# Patient Record
Sex: Female | Born: 1965 | Race: White | Hispanic: No | Smoking: Current every day smoker
Health system: Southern US, Community
[De-identification: ages and names within clinical notes are randomized; demographics above are authoritative.]

## PROBLEM LIST (undated history)

## (undated) DIAGNOSIS — G5600 Carpal tunnel syndrome, unspecified upper limb: Secondary | ICD-10-CM

## (undated) DIAGNOSIS — F909 Attention-deficit hyperactivity disorder, unspecified type: Secondary | ICD-10-CM

## (undated) DIAGNOSIS — F419 Anxiety disorder, unspecified: Secondary | ICD-10-CM

## (undated) DIAGNOSIS — G8929 Other chronic pain: Secondary | ICD-10-CM

## (undated) DIAGNOSIS — M1990 Unspecified osteoarthritis, unspecified site: Secondary | ICD-10-CM

---

## 1989-04-08 HISTORY — PX: LASER ABLATION OF THE CERVIX: SHX1949

## 2007-12-08 HISTORY — PX: ABDOMINAL HYSTERECTOMY: SHX81

## 2011-04-09 HISTORY — PX: BREAST EXCISIONAL BIOPSY: SUR124

## 2011-07-11 ENCOUNTER — Other Ambulatory Visit: Payer: Self-pay | Admitting: Family Medicine

## 2011-07-11 DIAGNOSIS — N63 Unspecified lump in unspecified breast: Secondary | ICD-10-CM

## 2011-07-18 ENCOUNTER — Ambulatory Visit
Admission: RE | Admit: 2011-07-18 | Discharge: 2011-07-18 | Disposition: A | Discharge status: Home / Self Care | Payer: BC Managed Care – PPO | Source: Ambulatory Visit | Attending: Family Medicine | Admitting: Family Medicine

## 2011-07-18 ENCOUNTER — Other Ambulatory Visit: Payer: Self-pay | Admitting: Family Medicine

## 2011-07-18 ENCOUNTER — Other Ambulatory Visit: Payer: Self-pay

## 2011-07-18 DIAGNOSIS — N63 Unspecified lump in unspecified breast: Secondary | ICD-10-CM

## 2011-08-12 ENCOUNTER — Ambulatory Visit (INDEPENDENT_AMBULATORY_CARE_PROVIDER_SITE_OTHER): Payer: BC Managed Care – PPO | Admitting: General Surgery

## 2011-08-30 ENCOUNTER — Ambulatory Visit (INDEPENDENT_AMBULATORY_CARE_PROVIDER_SITE_OTHER): Payer: BC Managed Care – PPO | Admitting: General Surgery

## 2011-09-13 ENCOUNTER — Ambulatory Visit (INDEPENDENT_AMBULATORY_CARE_PROVIDER_SITE_OTHER): Payer: BC Managed Care – PPO | Admitting: General Surgery

## 2011-09-13 ENCOUNTER — Encounter (INDEPENDENT_AMBULATORY_CARE_PROVIDER_SITE_OTHER): Payer: Self-pay | Admitting: General Surgery

## 2011-09-13 VITALS — BP 118/82 | HR 79 | Temp 97.0°F | Resp 14 | Ht 67.0 in | Wt 141.6 lb

## 2011-09-13 DIAGNOSIS — Z09 Encounter for follow-up examination after completed treatment for conditions other than malignant neoplasm: Secondary | ICD-10-CM

## 2011-09-13 NOTE — Progress Notes (Signed)
Patient ID: Laura Alexander, female   DOB: [birth year], 46 y.o.   MRN: [id number]  Chief Complaint  Patient presents with  . New Evaluation    New Pt. Fiberadenoma Right Br.    HPI Laura Alexander is a 46 y.o. female.  Referred by Dr. Laveda Abbe HPI This is a 3 yof who has had a right breast mass x 2 since 2009.  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  The upper inner quadrant mass has been increasingly painful and has enlarged over this time.  U/S now shows this to be 1.4x2.8x4.5 cm in size that is greater in size than in 2012.  There is an unchanged 0.7x1.1x1.2 cm mass 3 cm from the nipple.  History reviewed. No pertinent past medical history.  Past Surgical History  Procedure Date  . Abdominal hysterectomy 12/2007    Partial  . 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 No pertinent family history.  Social History History  Substance Use Topics  . Smoking status: Current Everyday Smoker -- 1.0 packs/day  . Smokeless tobacco: Never Used  . Alcohol Use: Yes     Social    Allergies  Allergen Reactions  . Eggs Or Egg-Derived Products Anaphylaxis    Tongue, throat, and ears itch  . Codeine Nausea And Vomiting    Current Outpatient Prescriptions  Medication Sig Dispense Refill  . amphetamine-dextroamphetamine (ADDERALL) 30 MG tablet Take 30 mg by mouth 2 (two) times daily.      . Chlorzoxazone (LORZONE) 750 MG TABS Take by mouth 1 day or 1 dose.      . Vitamin D, Ergocalciferol, (DRISDOL) 50000 UNITS CAPS Take 50,000 Units by mouth every 7 (seven) days.        Review of Systems Review of Systems  Constitutional: Negative for fever, chills and unexpected weight change.  HENT: Negative for hearing loss, congestion, sore throat, trouble swallowing and voice change.   Eyes: Negative for visual disturbance.  Respiratory: Negative for cough and wheezing.   Cardiovascular: Negative for chest pain,  palpitations and leg swelling.  Gastrointestinal: Negative for nausea, vomiting, abdominal pain, diarrhea, constipation, blood in stool, abdominal distention and anal bleeding.  Genitourinary: Negative for hematuria, vaginal bleeding and difficulty urinating.  Musculoskeletal: Negative for arthralgias.  Skin: Negative for rash and wound.  Neurological: Negative for seizures, syncope and headaches.  Hematological: Negative for adenopathy. Does not bruise/bleed easily.  Psychiatric/Behavioral: Negative for confusion.    Blood pressure 118/82, pulse 79, temperature 97 F (36.1 C), temperature source Temporal, resp. rate 14, height 5\' 7"  (1.702 m), weight 141 lb 9.6 oz (64.229 kg).  Physical Exam Physical Exam  Vitals reviewed. Constitutional: She appears well-developed and well-nourished.  Cardiovascular: Normal rate, regular rhythm and normal heart sounds.   Pulmonary/Chest: Effort normal. She has no wheezes. She has no rales. Right breast exhibits mass.    Lymphadenopathy:    She has no cervical adenopathy.    Data Reviewed *RADIOLOGY REPORT*  Clinical Data: 46 year old female with enlarging painful right  breast mass, which has been previously biopsied in 2009 with  reported result of fibroadenoma. The patient also complains of a  stable mass in her lower right breast, which has been unchanged in  her opinions for 15 years.  DIGITAL DIAGNOSTIC RIGHT MAMMOGRAM WITH CAD AND RIGHT BREAST  ULTRASOUND:  Comparison: Prior mammograms and ultrasound from [facility] in  [place] dated 07/27/2007 and  07/28/2007.  Prior mammogram from [facility] in [place] dated  01/11/2011.  Findings: Routine and spot compression views of the right breast  again demonstrate heterogeneously dense breast tissue.  A 4.5 x 5 cm circumscribed mass in the inner right breast contains  a biopsy clip and appears enlarged since prior studies.  A 1 x 1.4 cm circumscribed  oval mass in the lower right breast is  unchanged from 2009.  No other suspicious mass, distortion or worrisome calcifications  are noted within the right breast.  Mammographic images were processed with CAD.  On physical exam, a firm palpable mobile mass is identified at the  2 o'clock position 3 cm from the nipple.  A firm palpable mobile mass is identified at the 6:30 o'clock  position 3 cm from the nipple.  Ultrasound is performed, showing a 1.4 x 2.8 x 4.5 cm circumscribed  oval hypoechoic mass at the 2 o'clock position of the right breast  3 cm from the nipple, previously measuring 1 x 2.6 x 3.9 cm on  01/11/2011.  A 0.7 x 1.1 x 1.2 cm circumscribed oval hypoechoic mass at the 6:30  position of the right breast 3 cm from the nipple is also  identified.  No enlarged or abnormal appearing lymph nodes in the right axilla  are noted.  IMPRESSION:  Enlarging 1.4 x 2.8 x 4.5 cm mass in the upper inner right breast.  Although this may represent an enlarging fibroadenoma, a phylloides  tumor is not excluded. Given this fact and patient's  discomfort/pain, surgical consultation is recommended for possible  excision.   Assessment    Right breast mass x 2    Plan    I think both of these should be excised. The UIQ one is larger and may end up causing a cosmetic defect but would wait until she recovers to see if any further surgery will be needed.  I told her I think her breast will keep its shape but may just be smaller than the other side.  The risks include bleeding, infection, possibly further surgery.  She also has carpal tunnel issues that she is due to see hand surgery for. I gave her the name of the hand surgeons in [place] and would be happy to combine this with them.  I will wait to hear back from her either way.        Laura Alexander 09/13/2011, 9:50 AM

## 2011-11-29 ENCOUNTER — Other Ambulatory Visit (INDEPENDENT_AMBULATORY_CARE_PROVIDER_SITE_OTHER): Payer: Self-pay | Admitting: General Surgery

## 2011-11-29 ENCOUNTER — Telehealth (INDEPENDENT_AMBULATORY_CARE_PROVIDER_SITE_OTHER): Payer: Self-pay

## 2011-11-29 NOTE — Telephone Encounter (Signed)
LMOM at work and cell for pt to call me back. I spoke to Dr Dwain Sarna about moving pt's appt up earlier than the appt for 8/30 but we don't have anything earlier now. I did get Dr Dwain Sarna to complete surgical orders so I will take the orders to scheduling once I have spoken to the pt about Dr Doreen Salvage plan.

## 2011-12-02 NOTE — Telephone Encounter (Signed)
LMOM another message for the pt to call me back so I can explain about getting her scheduled for surgery.

## 2011-12-02 NOTE — Telephone Encounter (Signed)
Pt returned my call. I explained to her Dr Doreen Salvage plan about going a head and scheduling a date for sx so when she comes to her appt that date will already be set up and there want be any delay. The pt understands and is ok with scheduling. I took her surgical orders to scheduling.

## 2011-12-06 ENCOUNTER — Ambulatory Visit (INDEPENDENT_AMBULATORY_CARE_PROVIDER_SITE_OTHER): Payer: BC Managed Care – PPO | Admitting: General Surgery

## 2011-12-06 ENCOUNTER — Other Ambulatory Visit (INDEPENDENT_AMBULATORY_CARE_PROVIDER_SITE_OTHER): Payer: Self-pay | Admitting: General Surgery

## 2011-12-06 ENCOUNTER — Encounter (INDEPENDENT_AMBULATORY_CARE_PROVIDER_SITE_OTHER): Payer: Self-pay | Admitting: General Surgery

## 2011-12-06 VITALS — BP 132/78 | HR 94 | Ht 67.0 in | Wt 143.0 lb

## 2011-12-06 DIAGNOSIS — N63 Unspecified lump in unspecified breast: Secondary | ICD-10-CM

## 2011-12-06 NOTE — Progress Notes (Signed)
Patient ID: Laura Alexander, female   DOB: 01-20-1966, 46 y.o.   MRN: [id number]  Chief Complaint  Patient presents with  . Follow-up    reck Rt br mass    HPI Laura Alexander is a 46 y.o. female.   HPI This is a 89 yof who has had a right breast mass x 2 since 2009. 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 The upper inner quadrant mass has been increasingly painful and has enlarged over this time. U/S now shows this to be 1.4x2.8x4.5 cm in size that is greater in size than in 2012. There is an unchanged 0.7x1.1x1.2 cm mass 3 cm from the nipple. I recommended a biopsy previously and she returns today to discuss.  She reports no changes since last visit.  History reviewed. No pertinent past medical history.  Past Surgical History  Procedure Date  . Abdominal hysterectomy 12/2007    Partial  . 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 No pertinent family history.  Social History History  Substance Use Topics  . Smoking status: Current Everyday Smoker -- 1.0 packs/day  . Smokeless tobacco: Never Used  . Alcohol Use: Yes     Social    Allergies  Allergen Reactions  . Eggs Or Egg-Derived Products Anaphylaxis    Tongue, throat, and ears itch  . Codeine Nausea And Vomiting    Current Outpatient Prescriptions  Medication Sig Dispense Refill  . amphetamine-dextroamphetamine (ADDERALL) 30 MG tablet Take 30 mg by mouth 2 (two) times daily.      . Chlorzoxazone (LORZONE) 750 MG TABS Take by mouth 1 day or 1 dose.      . Vitamin D, Ergocalciferol, (DRISDOL) 50000 UNITS CAPS Take 50,000 Units by mouth every 7 (seven) days.        Review of Systems Review of Systems  Blood pressure 132/78, pulse 94, height 5\' 7"  (1.702 m), weight 143 lb (64.864 kg), SpO2 96.00%.  Physical Exam Physical Exam  Vitals reviewed. Constitutional: She appears well-developed and well-nourished.  Neck: Neck supple.    Cardiovascular: Normal rate, regular rhythm and normal heart sounds.   Pulmonary/Chest: Effort normal and breath sounds normal. She has no wheezes. She has no rales. Right breast exhibits mass. Right breast exhibits no inverted nipple, no nipple discharge, no skin change and no tenderness. Left breast exhibits no inverted nipple, no mass, no nipple discharge, no skin change and no tenderness. Breasts are symmetrical.    Lymphadenopathy:    She has no cervical adenopathy.    She has no axillary adenopathy.       Right: No supraclavicular adenopathy present.       Left: No supraclavicular adenopathy present.    Data Reviewed DIGITAL DIAGNOSTIC RIGHT MAMMOGRAM WITH CAD AND RIGHT BREAST  ULTRASOUND:  Comparison: Prior mammograms and ultrasound from [facility] in [facility] dated 07/27/2007 and  07/28/2007.  Prior mammogram from [facility] in [place] dated  01/11/2011.  Findings: Routine and spot compression views of the right breast  again demonstrate heterogeneously dense breast tissue.  A 4.5 x 5 cm circumscribed mass in the inner right breast contains  a biopsy clip and appears enlarged since prior studies.  A 1 x 1.4 cm circumscribed oval mass in the lower right breast is  unchanged from 2009.  No other suspicious mass, distortion or worrisome calcifications  are noted within the  right breast.  Mammographic images were processed with CAD.  On physical exam, a firm palpable mobile mass is identified at the  2 o'clock position 3 cm from the nipple.  A firm palpable mobile mass is identified at the 6:30 o'clock  position 3 cm from the nipple.  Ultrasound is performed, showing a 1.4 x 2.8 x 4.5 cm circumscribed  oval hypoechoic mass at the 2 o'clock position of the right breast  3 cm from the nipple, previously measuring 1 x 2.6 x 3.9 cm on  01/11/2011.  A 0.7 x 1.1 x 1.2 cm circumscribed oval hypoechoic mass at the 6:30  position of the right  breast 3 cm from the nipple is also  identified.  No enlarged or abnormal appearing lymph nodes in the right axilla  are noted.  IMPRESSION:  Enlarging 1.4 x 2.8 x 4.5 cm mass in the upper inner right breast.  Although this may represent an enlarging fibroadenoma, a phylloides  tumor is not excluded. Given this fact and patient's  discomfort/pain, surgical consultation is recommended for possible  excision.  Stable 0.7 x 1.1 x 1.2 cm benign mass in the lower right breast -  likely a fibroadenoma and unchanged over a 4-year time period.    Assessment    Right breast mass x2    Plan    Right breast mass excision and wire localization of smaller mass I think both of these should be excised. The UIQ one is larger and may end up causing a cosmetic defect but would wait until she recovers to see if any further surgery will be needed. I told her I think her breast will keep its shape but may just be smaller than the other side. The risks include bleeding, infection, possibly further surgery.         Laura Alexander 12/06/2011, 9:40 AM

## 2011-12-11 ENCOUNTER — Encounter (HOSPITAL_BASED_OUTPATIENT_CLINIC_OR_DEPARTMENT_OTHER)
Admission: RE | Admit: 2011-12-11 | Discharge: 2011-12-11 | Disposition: A | Discharge status: Home / Self Care | Payer: BC Managed Care – PPO | Source: Ambulatory Visit | Attending: General Surgery | Admitting: General Surgery

## 2011-12-11 ENCOUNTER — Encounter (HOSPITAL_BASED_OUTPATIENT_CLINIC_OR_DEPARTMENT_OTHER): Payer: Self-pay | Admitting: *Deleted

## 2011-12-11 LAB — CBC
HCT: 37.4 % (ref 36.0–46.0)
MCH: 31.7 pg (ref 26.0–34.0)
MCV: 91.2 fL (ref 78.0–100.0)
RDW: 12.6 % (ref 11.5–15.5)
WBC: 9.9 10*3/uL (ref 4.0–10.5)

## 2011-12-11 LAB — BASIC METABOLIC PANEL
CO2: 22 mEq/L (ref 19–32)
Calcium: 9.3 mg/dL (ref 8.4–10.5)
Chloride: 101 mEq/L (ref 96–112)
Creatinine, Ser: 0.55 mg/dL (ref 0.50–1.10)
Glucose, Bld: 110 mg/dL — ABNORMAL HIGH (ref 70–99)

## 2011-12-11 NOTE — Progress Notes (Signed)
To come in for labs Pt also sch for ctr here 9/13

## 2011-12-12 ENCOUNTER — Encounter (HOSPITAL_BASED_OUTPATIENT_CLINIC_OR_DEPARTMENT_OTHER): Payer: Self-pay | Admitting: Certified Registered"

## 2011-12-12 ENCOUNTER — Encounter (HOSPITAL_BASED_OUTPATIENT_CLINIC_OR_DEPARTMENT_OTHER): Payer: Self-pay | Admitting: *Deleted

## 2011-12-12 ENCOUNTER — Encounter (HOSPITAL_BASED_OUTPATIENT_CLINIC_OR_DEPARTMENT_OTHER)
Admission: RE | Disposition: A | Discharge status: Home / Self Care | Payer: Self-pay | Source: Ambulatory Visit | Attending: General Surgery

## 2011-12-12 ENCOUNTER — Ambulatory Visit
Admission: RE | Admit: 2011-12-12 | Discharge: 2011-12-12 | Disposition: A | Discharge status: Home / Self Care | Payer: BC Managed Care – PPO | Source: Ambulatory Visit | Attending: General Surgery | Admitting: General Surgery

## 2011-12-12 ENCOUNTER — Other Ambulatory Visit (INDEPENDENT_AMBULATORY_CARE_PROVIDER_SITE_OTHER): Payer: Self-pay | Admitting: General Surgery

## 2011-12-12 ENCOUNTER — Ambulatory Visit (HOSPITAL_BASED_OUTPATIENT_CLINIC_OR_DEPARTMENT_OTHER)
Admission: RE | Admit: 2011-12-12 | Discharge: 2011-12-12 | Disposition: A | Discharge status: Home / Self Care | Payer: BC Managed Care – PPO | Source: Ambulatory Visit | Attending: General Surgery | Admitting: General Surgery

## 2011-12-12 ENCOUNTER — Ambulatory Visit (HOSPITAL_BASED_OUTPATIENT_CLINIC_OR_DEPARTMENT_OTHER): Payer: BC Managed Care – PPO | Admitting: Certified Registered"

## 2011-12-12 DIAGNOSIS — Z01812 Encounter for preprocedural laboratory examination: Secondary | ICD-10-CM | POA: Insufficient documentation

## 2011-12-12 DIAGNOSIS — F17200 Nicotine dependence, unspecified, uncomplicated: Secondary | ICD-10-CM | POA: Insufficient documentation

## 2011-12-12 DIAGNOSIS — N63 Unspecified lump in unspecified breast: Secondary | ICD-10-CM

## 2011-12-12 DIAGNOSIS — N6019 Diffuse cystic mastopathy of unspecified breast: Secondary | ICD-10-CM

## 2011-12-12 DIAGNOSIS — D249 Benign neoplasm of unspecified breast: Secondary | ICD-10-CM | POA: Insufficient documentation

## 2011-12-12 HISTORY — DX: Carpal tunnel syndrome, unspecified upper limb: G56.00

## 2011-12-12 HISTORY — PX: BREAST BIOPSY: SHX20

## 2011-12-12 HISTORY — PX: MASS EXCISION: SHX2000

## 2011-12-12 SURGERY — EXCISION MASS
Anesthesia: General | Site: Breast | Laterality: Right | Wound class: Clean

## 2011-12-12 MED ORDER — HYDROCODONE-ACETAMINOPHEN 5-325 MG PO TABS: 1.0000 | ORAL_TABLET | Freq: Four times a day (QID) | ORAL | Status: DC | PRN

## 2011-12-12 MED ORDER — BUPIVACAINE HCL (PF) 0.25 % IJ SOLN
INTRAMUSCULAR | Status: DC | PRN
  Administered 2011-12-12: 20 mL

## 2011-12-12 MED ORDER — PROMETHAZINE HCL 25 MG/ML IJ SOLN: 6.2500 mg | INTRAMUSCULAR | Status: DC | PRN

## 2011-12-12 MED ORDER — CEFAZOLIN SODIUM 1-5 GM-% IV SOLN: 1.0000 g | INTRAVENOUS | Status: DC

## 2011-12-12 MED ORDER — HYDROMORPHONE HCL PF 1 MG/ML IJ SOLN
0.2500 mg | INTRAMUSCULAR | Status: DC | PRN
  Administered 2011-12-12 (×2): 0.5 mg via INTRAVENOUS

## 2011-12-12 MED ORDER — CEFAZOLIN SODIUM-DEXTROSE 2-3 GM-% IV SOLR
2.0000 g | INTRAVENOUS | Status: AC
  Administered 2011-12-12: 2 g via INTRAVENOUS

## 2011-12-12 MED ORDER — LIDOCAINE HCL (CARDIAC) 20 MG/ML IV SOLN
INTRAVENOUS | Status: DC | PRN
  Administered 2011-12-12: 20 mg via INTRAVENOUS

## 2011-12-12 MED ORDER — LACTATED RINGERS IV SOLN
INTRAVENOUS | Status: DC
  Administered 2011-12-12 (×2): via INTRAVENOUS

## 2011-12-12 MED ORDER — HYDROCODONE-ACETAMINOPHEN 5-325 MG PO TABS
1.0000 | ORAL_TABLET | Freq: Once | ORAL | Status: AC
  Administered 2011-12-12: 1 via ORAL

## 2011-12-12 MED ORDER — MEPERIDINE HCL 25 MG/ML IJ SOLN: 6.2500 mg | INTRAMUSCULAR | Status: DC | PRN

## 2011-12-12 MED ORDER — FENTANYL CITRATE 0.05 MG/ML IJ SOLN
INTRAMUSCULAR | Status: DC | PRN
  Administered 2011-12-12: 100 ug via INTRAVENOUS

## 2011-12-12 MED ORDER — PROPOFOL 10 MG/ML IV BOLUS
INTRAVENOUS | Status: DC | PRN
  Administered 2011-12-12: 200 mg via INTRAVENOUS

## 2011-12-12 MED ORDER — CEFAZOLIN SODIUM-DEXTROSE 2-3 GM-% IV SOLR: 2.0000 g | INTRAVENOUS | Status: DC

## 2011-12-12 MED ORDER — DEXAMETHASONE SODIUM PHOSPHATE 4 MG/ML IJ SOLN
INTRAMUSCULAR | Status: DC | PRN
  Administered 2011-12-12: 8 mg via INTRAVENOUS

## 2011-12-12 MED ORDER — MIDAZOLAM HCL 5 MG/5ML IJ SOLN
INTRAMUSCULAR | Status: DC | PRN
  Administered 2011-12-12: 2 mg via INTRAVENOUS

## 2011-12-12 MED ORDER — MIDAZOLAM HCL 2 MG/2ML IJ SOLN: 0.5000 mg | Freq: Once | INTRAMUSCULAR | Status: DC | PRN

## 2011-12-12 SURGICAL SUPPLY — 62 items
APPLIER CLIP 9.375 MED OPEN (MISCELLANEOUS)
BENZOIN TINCTURE PRP APPL 2/3 (GAUZE/BANDAGES/DRESSINGS) ×2 IMPLANT
BINDER BREAST LRG (GAUZE/BANDAGES/DRESSINGS) ×2 IMPLANT
BINDER BREAST MEDIUM (GAUZE/BANDAGES/DRESSINGS) IMPLANT
BINDER BREAST XLRG (GAUZE/BANDAGES/DRESSINGS) IMPLANT
BINDER BREAST XXLRG (GAUZE/BANDAGES/DRESSINGS) IMPLANT
BLADE SURG 15 STRL LF DISP TIS (BLADE) ×1 IMPLANT
BLADE SURG 15 STRL SS (BLADE) ×1
BLADE SURG ROTATE 9660 (MISCELLANEOUS) IMPLANT
CANISTER SUCTION 1200CC (MISCELLANEOUS) IMPLANT
CHLORAPREP W/TINT 26ML (MISCELLANEOUS) ×2 IMPLANT
CLIP APPLIE 9.375 MED OPEN (MISCELLANEOUS) IMPLANT
CLOTH BEACON ORANGE TIMEOUT ST (SAFETY) ×2 IMPLANT
COVER MAYO STAND STRL (DRAPES) ×2 IMPLANT
COVER TABLE BACK 60X90 (DRAPES) ×2 IMPLANT
DECANTER SPIKE VIAL GLASS SM (MISCELLANEOUS) IMPLANT
DERMABOND ADVANCED (GAUZE/BANDAGES/DRESSINGS)
DERMABOND ADVANCED .7 DNX12 (GAUZE/BANDAGES/DRESSINGS) IMPLANT
DEVICE DUBIN W/COMP PLATE 8390 (MISCELLANEOUS) ×4 IMPLANT
DRAPE PED LAPAROTOMY (DRAPES) ×2 IMPLANT
DRSG TEGADERM 4X4.75 (GAUZE/BANDAGES/DRESSINGS) ×2 IMPLANT
ELECT COATED BLADE 2.86 ST (ELECTRODE) ×2 IMPLANT
ELECT REM PT RETURN 9FT ADLT (ELECTROSURGICAL) ×2
ELECTRODE REM PT RTRN 9FT ADLT (ELECTROSURGICAL) ×1 IMPLANT
GAUZE PACKING IODOFORM 1/4X5 (PACKING) IMPLANT
GAUZE SPONGE 4X4 12PLY STRL LF (GAUZE/BANDAGES/DRESSINGS) ×2 IMPLANT
GLOVE BIO SURGEON STRL SZ7 (GLOVE) ×4 IMPLANT
GLOVE BIO SURGEON STRL SZ7.5 (GLOVE) ×2 IMPLANT
GLOVE BIOGEL PI IND STRL 6.5 (GLOVE) ×1 IMPLANT
GLOVE BIOGEL PI IND STRL 7.5 (GLOVE) ×1 IMPLANT
GLOVE BIOGEL PI INDICATOR 6.5 (GLOVE) ×1
GLOVE BIOGEL PI INDICATOR 7.5 (GLOVE) ×1
GOWN PREVENTION PLUS XLARGE (GOWN DISPOSABLE) ×4 IMPLANT
KIT PROBE COVER (MISCELLANEOUS) ×2 IMPLANT
NEEDLE HYPO 25X1 1.5 SAFETY (NEEDLE) ×2 IMPLANT
NS IRRIG 1000ML POUR BTL (IV SOLUTION) ×2 IMPLANT
PACK BASIN DAY SURGERY FS (CUSTOM PROCEDURE TRAY) ×2 IMPLANT
PENCIL BUTTON HOLSTER BLD 10FT (ELECTRODE) ×2 IMPLANT
SLEEVE SCD COMPRESS KNEE MED (MISCELLANEOUS) ×2 IMPLANT
SPONGE LAP 4X18 X RAY DECT (DISPOSABLE) ×2 IMPLANT
STRIP CLOSURE SKIN 1/2X4 (GAUZE/BANDAGES/DRESSINGS) ×2 IMPLANT
SUT ETHILON 2 0 FS 18 (SUTURE) IMPLANT
SUT MNCRL AB 4-0 PS2 18 (SUTURE) ×2 IMPLANT
SUT MON AB 5-0 PS2 18 (SUTURE) ×2 IMPLANT
SUT SILK 2 0 SH (SUTURE) ×2 IMPLANT
SUT VIC AB 2-0 SH 27 (SUTURE) ×1
SUT VIC AB 2-0 SH 27XBRD (SUTURE) ×1 IMPLANT
SUT VIC AB 3-0 SH 27 (SUTURE) ×1
SUT VIC AB 3-0 SH 27X BRD (SUTURE) ×1 IMPLANT
SUT VIC AB 5-0 PS2 18 (SUTURE) IMPLANT
SUT VICRYL 3-0 CR8 SH (SUTURE) IMPLANT
SUT VICRYL 4-0 PS2 18IN ABS (SUTURE) IMPLANT
SUT VICRYL AB 3 0 TIES (SUTURE) IMPLANT
SWAB CULTURE LIQ STUART DBL (MISCELLANEOUS) IMPLANT
SYR CONTROL 10ML LL (SYRINGE) ×2 IMPLANT
TOWEL OR 17X24 6PK STRL BLUE (TOWEL DISPOSABLE) ×2 IMPLANT
TOWEL OR NON WOVEN STRL DISP B (DISPOSABLE) ×2 IMPLANT
TUBE ANAEROBIC SPECIMEN COL (MISCELLANEOUS) IMPLANT
TUBE CONNECTING 20X1/4 (TUBING) IMPLANT
UNDERPAD 30X30 INCONTINENT (UNDERPADS AND DIAPERS) IMPLANT
WATER STERILE IRR 1000ML POUR (IV SOLUTION) IMPLANT
YANKAUER SUCT BULB TIP NO VENT (SUCTIONS) IMPLANT

## 2011-12-12 NOTE — Interval H&P Note (Signed)
History and Physical Interval Note:  12/12/2011 12:33 PM  Laura Alexander  has presented today for surgery, with the diagnosis of right breast masses X2  The various methods of treatment have been discussed with the patient and family. After consideration of risks, benefits and other options for treatment, the patient has consented to  Procedure(s) (LRB) with comments: EXCISION MASS (Right) - Right breast masses excisions times two, right breast wire loc biopsy BREAST BIOPSY WITH NEEDLE LOCALIZATION (Right) as a surgical intervention .  The patient's history has been reviewed, patient examined, no change in status, stable for surgery.  I have reviewed the patient's chart and labs.  Questions were answered to the patient's satisfaction.     [name]

## 2011-12-12 NOTE — Anesthesia Postprocedure Evaluation (Signed)
  Anesthesia Post-op Note  Patient: Laura Alexander  Procedure(s) Performed: Procedure(s) (LRB) with comments: EXCISION MASS (Right) - Right breast masses excisions times two, right breast wire localization biopsy BREAST BIOPSY WITH NEEDLE LOCALIZATION (Right)  Patient Location: PACU  Anesthesia Type: General  Level of Consciousness: awake, alert  and oriented  Airway and Oxygen Therapy: Patient Spontanous Breathing  Post-op Pain: none  Post-op Assessment: Post-op Vital signs reviewed, Patient's Cardiovascular Status Stable, Respiratory Function Stable, Patent Airway, No signs of Nausea or vomiting and Pain level controlled  Post-op Vital Signs: Reviewed and stable  Complications: No apparent anesthesia complications

## 2011-12-12 NOTE — Discharge Instructions (Signed)
[facility] °Office Phone Number [phone] ° °BREAST BIOPSY/ PARTIAL MASTECTOMY: POST OP INSTRUCTIONS ° °Always review your discharge instruction sheet given to you by the facility where your surgery was performed. ° °IF YOU HAVE DISABILITY OR FAMILY LEAVE FORMS, YOU MUST BRING THEM TO THE OFFICE FOR PROCESSING.  DO NOT GIVE THEM TO YOUR DOCTOR. ° °1. A prescription for pain medication may be given to you upon discharge.  Take your pain medication as prescribed, if needed.  If narcotic pain medicine is not needed, then you may take acetaminophen (Tylenol), naprosyn (Alleve) or ibuprofen (Advil) as needed. °2. Take your usually prescribed medications unless otherwise directed °3. If you need a refill on your pain medication, please contact your pharmacy.  They will contact our office to request authorization.  Prescriptions will not be filled after 5pm or on week-ends. °4. You should eat very light the first 24 hours after surgery, such as soup, crackers, pudding, etc.  Resume your normal diet the day after surgery. °5. Most patients will experience some swelling and bruising in the breast.  Ice packs and a good support bra will help.  Wear the breast binder provided or a sports bra for 72 hours day and night.  After that wear a sports bra during the day until you return to the office. Swelling and bruising can take several days to resolve.  °6. It is common to experience some constipation if taking pain medication after surgery.  Increasing fluid intake and taking a stool softener will usually help or prevent this problem from occurring.  A mild laxative (Milk of Magnesia or Miralax) should be taken according to package directions if there are no bowel movements after 48 hours. °7. Unless discharge instructions indicate otherwise, you may remove your bandages 48 hours after surgery and you may shower at that time.  You may have steri-strips (small skin tapes) in place directly over the incision.   These strips should be left on the skin for 7-10 days and will come off on their own.  If your surgeon used skin glue on the incision, you may shower in 24 hours.  The glue will flake off over the next 2-3 weeks.  Any sutures or staples will be removed at the office during your follow-up visit. °8. ACTIVITIES:  You may resume regular daily activities (gradually increasing) beginning the next day.  Wearing a good support bra or sports bra minimizes pain and swelling.  You may have sexual intercourse when it is comfortable. °a. You may drive when you no longer are taking prescription pain medication, you can comfortably wear a seatbelt, and you can safely maneuver your car and apply brakes. °b. RETURN TO WORK:  ______________________________________________________________________________________ °9. You should see your doctor in the office for a follow-up appointment approximately two weeks after your surgery.  Your doctor’s nurse will typically make your follow-up appointment when she calls you with your pathology report.  Expect your pathology report 3-4 business days after your surgery.  You may call to check if you do not hear from us after three days. °10. OTHER INSTRUCTIONS: _______________________________________________________________________________________________ _____________________________________________________________________________________________________________________________________ °_____________________________________________________________________________________________________________________________________ °_____________________________________________________________________________________________________________________________________ ° °WHEN TO CALL DR WAKEFIELD: °1. Fever over 101.0 °2. Nausea and/or vomiting. °3. Extreme swelling or bruising. °4. Continued bleeding from incision. °5. Increased pain, redness, or drainage from the incision. ° °The clinic staff is available to  answer your questions during regular business hours.  Please don’t hesitate to call and ask to speak to one of the nurses for   clinical concerns.  If you have a medical emergency, go to the nearest emergency room or call 911.  A surgeon from [facility] is always on call at the hospital. ° °For further questions, please visit [email or web address] ° °Post Anesthesia Home Care Instructions ° °Activity: °Get plenty of rest for the remainder of the day. A responsible adult should stay with you for 24 hours following the procedure.  °For the next 24 hours, DO NOT: °-Drive a car °-Operate machinery °-Drink alcoholic beverages °-Take any medication unless instructed by your physician °-Make any legal decisions or sign important papers. ° °Meals: °Start with liquid foods such as gelatin or soup. Progress to regular foods as tolerated. Avoid greasy, spicy, heavy foods. If nausea and/or vomiting occur, drink only clear liquids until the nausea and/or vomiting subsides. Call your physician if vomiting continues. ° °Special Instructions/Symptoms: °Your throat may feel dry or sore from the anesthesia or the breathing tube placed in your throat during surgery. If this causes discomfort, gargle with warm salt water. The discomfort should disappear within 24 hours. ° °

## 2011-12-12 NOTE — Transfer of Care (Signed)
Immediate Anesthesia Transfer of Care Note  Patient: Laura Alexander  Procedure(s) Performed: Procedure(s) (LRB) with comments: EXCISION MASS (Right) - Right breast masses excisions times two, right breast wire localization biopsy BREAST BIOPSY WITH NEEDLE LOCALIZATION (Right)  Patient Location: PACU  Anesthesia Type: General  Level of Consciousness: awake, alert , oriented and patient cooperative  Airway & Oxygen Therapy: Patient Spontanous Breathing and Patient connected to face mask oxygen  Post-op Assessment: Report given to PACU RN and Post -op Vital signs reviewed and stable  Post vital signs: Reviewed and stable  Complications: No apparent anesthesia complications

## 2011-12-12 NOTE — Anesthesia Procedure Notes (Signed)
Procedure Name: LMA Insertion Date/Time: 12/12/2011 12:40 PM Performed by: Verlan Friends Pre-anesthesia Checklist: Patient identified, Emergency Drugs available, Suction available, Patient being monitored and Timeout performed Patient Re-evaluated:Patient Re-evaluated prior to inductionOxygen Delivery Method: Circle System Utilized Preoxygenation: Pre-oxygenation with 100% oxygen Intubation Type: IV induction Ventilation: Mask ventilation without difficulty LMA: LMA inserted LMA Size: 4.0 Number of attempts: 1 Airway Equipment and Method: bite block Placement Confirmation: positive ETCO2 Tube secured with: Tape Dental Injury: Teeth and Oropharynx as per pre-operative assessment

## 2011-12-12 NOTE — Op Note (Signed)
Preoperative diagnosis: RIght breast mass x2 Postoperative diagnosis: Same as above Procedure: #1 right breast mass excision #2 right breast wire-guided excisional biopsy Surgeon: Dr. Harden Mo Anesthesia: Gen. 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 These have been increasing in size and we discussed excision. The risks and benefits were discussed prior to beginning.  Procedure: After informed consent was obtained I had the patient first with the breast around the wire placed in the inferior mass. This little but more difficult to feel. I had these mammograms available from our view in the operating room. She was administered 2 g of intravenous cefazolin. Sequential compression devices were placed on her legs. She was placed under  general anesthesia without consultation. Her right breast was prepped and draped in the standard sterile surgical fashion. A surgical timeout was performed.  I infiltrated Marcaine throughout the areas of both masses. I used the ultrasound to identify the superior mass. I made a periareolar incision and then used cautery to excise what appeared to be a fibroadenoma. The clip from her prior biopsy was evident as well. I did take a mammogram to confirm that the clip had been removed and this is the case. I then obtained hemostasis. I closed this with 2-0 Vicryl, 3-0 Vicryl, and 5-0 Monocryl. I then made the incision over the lower mass. I removed the wire as well as the palpable mass which also appeared to be a fibroadenoma. Mammogram was taken confirming  removal of the wire. I then closed this in a similar fashion. I infiltrated more Marcaine in both of these areas. I then placed Steri-Strips and sterile dressings over both of these. A breast binder was placed. She tolerated this well was extubated and transferred to recovery stable.

## 2011-12-12 NOTE — Anesthesia Preprocedure Evaluation (Signed)
Anesthesia Evaluation  Patient identified by MRN, date of birth, ID band Patient awake    Reviewed: Allergy & Precautions, H&P , NPO status   History of Anesthesia Complications Negative for: history of anesthetic complications  Airway Mallampati: I TM Distance: >3 FB Neck ROM: Full    Dental  (+) Caps and Dental Advisory Given   Pulmonary Current Smoker,  breath sounds clear to auscultation  Pulmonary exam normal       Cardiovascular negative cardio ROS  Rhythm:Regular Rate:Normal     Neuro/Psych    GI/Hepatic negative GI ROS, Neg liver ROS,   Endo/Other  negative endocrine ROS  Renal/GU negative Renal ROS     Musculoskeletal   Abdominal   Peds  Hematology negative hematology ROS (+)   Anesthesia Other Findings   Reproductive/Obstetrics                           Anesthesia Physical Anesthesia Plan  ASA: II  Anesthesia Plan: General   Post-op Pain Management:    Induction: Intravenous  Airway Management Planned: LMA  Additional Equipment:   Intra-op Plan:   Post-operative Plan:   Informed Consent: I have reviewed the patients History and Physical, chart, labs and discussed the procedure including the risks, benefits and alternatives for the proposed anesthesia with the patient or authorized representative who has indicated his/her understanding and acceptance.   Dental advisory given  Plan Discussed with: CRNA and Surgeon  Anesthesia Plan Comments: (Plan routine monitors, GA- LMA)        Anesthesia Quick Evaluation

## 2011-12-12 NOTE — H&P (View-Only) (Signed)
Patient ID: Laura Alexander, female   DOB: 08/28/1965, 46 y.o.   MRN: [id number]  Chief Complaint  Patient presents with  . Follow-up    reck Rt br mass    HPI Laura Alexander is a 46 y.o. female.   HPI This is a 46 yof who has had a right breast mass x 2 since 2009. 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 The upper inner quadrant mass has been increasingly painful and has enlarged over this time. U/S now shows this to be 1.4x2.8x4.5 cm in size that is greater in size than in 2012. There is an unchanged 0.7x1.1x1.2 cm mass 3 cm from the nipple. I recommended a biopsy previously and she returns today to discuss.  She reports no changes since last visit.  History reviewed. No pertinent past medical history.  Past Surgical History  Procedure Date  . Abdominal hysterectomy 12/2007    Partial  . 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 No pertinent family history.  Social History History  Substance Use Topics  . Smoking status: Current Everyday Smoker -- 1.0 packs/day  . Smokeless tobacco: Never Used  . Alcohol Use: Yes     Social    Allergies  Allergen Reactions  . Eggs Or Egg-Derived Products Anaphylaxis    Tongue, throat, and ears itch  . Codeine Nausea And Vomiting    Current Outpatient Prescriptions  Medication Sig Dispense Refill  . amphetamine-dextroamphetamine (ADDERALL) 30 MG tablet Take 30 mg by mouth 2 (two) times daily.      . Chlorzoxazone (LORZONE) 750 MG TABS Take by mouth 1 day or 1 dose.      . Vitamin D, Ergocalciferol, (DRISDOL) 50000 UNITS CAPS Take 50,000 Units by mouth every 7 (seven) days.        Review of Systems Review of Systems  Blood pressure 132/78, pulse 94, height 5' 7" (1.702 m), weight 143 lb (64.864 kg), SpO2 96.00%.  Physical Exam Physical Exam  Vitals reviewed. Constitutional: She appears well-developed and well-nourished.  Neck: Neck supple.    Cardiovascular: Normal rate, regular rhythm and normal heart sounds.   Pulmonary/Chest: Effort normal and breath sounds normal. She has no wheezes. She has no rales. Right breast exhibits mass. Right breast exhibits no inverted nipple, no nipple discharge, no skin change and no tenderness. Left breast exhibits no inverted nipple, no mass, no nipple discharge, no skin change and no tenderness. Breasts are symmetrical.    Lymphadenopathy:    She has no cervical adenopathy.    She has no axillary adenopathy.       Right: No supraclavicular adenopathy present.       Left: No supraclavicular adenopathy present.    Data Reviewed DIGITAL DIAGNOSTIC RIGHT MAMMOGRAM WITH CAD AND RIGHT BREAST  ULTRASOUND:  Comparison: Prior mammograms and ultrasound from [facility] in [place] dated 07/27/2007 and  07/28/2007.  Prior mammogram from [facility] in [place] dated  01/11/2011.  Findings: Routine and spot compression views of the right breast  again demonstrate heterogeneously dense breast tissue.  A 4.5 x 5 cm circumscribed mass in the inner right breast contains  a biopsy clip and appears enlarged since prior studies.  A 1 x 1.4 cm circumscribed oval mass in the lower right breast is  unchanged from 2009.  No other suspicious mass, distortion or worrisome calcifications  are noted within the   right breast.  Mammographic images were processed with CAD.  On physical exam, a firm palpable mobile mass is identified at the  2 o'clock position 3 cm from the nipple.  A firm palpable mobile mass is identified at the 6:30 o'clock  position 3 cm from the nipple.  Ultrasound is performed, showing a 1.4 x 2.8 x 4.5 cm circumscribed  oval hypoechoic mass at the 2 o'clock position of the right breast  3 cm from the nipple, previously measuring 1 x 2.6 x 3.9 cm on  01/11/2011.  A 0.7 x 1.1 x 1.2 cm circumscribed oval hypoechoic mass at the 6:30  position of the right  breast 3 cm from the nipple is also  identified.  No enlarged or abnormal appearing lymph nodes in the right axilla  are noted.  IMPRESSION:  Enlarging 1.4 x 2.8 x 4.5 cm mass in the upper inner right breast.  Although this may represent an enlarging fibroadenoma, a phylloides  tumor is not excluded. Given this fact and patient's  discomfort/pain, surgical consultation is recommended for possible  excision.  Stable 0.7 x 1.1 x 1.2 cm benign mass in the lower right breast -  likely a fibroadenoma and unchanged over a 4-year time period.    Assessment    Right breast mass x2    Plan    Right breast mass excision and wire localization of smaller mass I think both of these should be excised. The UIQ one is larger and may end up causing a cosmetic defect but would wait until she recovers to see if any further surgery will be needed. I told her I think her breast will keep its shape but may just be smaller than the other side. The risks include bleeding, infection, possibly further surgery.         Yuki Purves 12/06/2011, 9:40 AM    

## 2011-12-13 ENCOUNTER — Encounter (HOSPITAL_BASED_OUTPATIENT_CLINIC_OR_DEPARTMENT_OTHER): Payer: Self-pay | Admitting: General Surgery

## 2011-12-13 MED ORDER — BACITRACIN ZINC 500 UNIT/GM EX OINT
TOPICAL_OINTMENT | CUTANEOUS | Status: AC
  Filled 2011-12-13: qty 15

## 2011-12-16 ENCOUNTER — Telehealth (INDEPENDENT_AMBULATORY_CARE_PROVIDER_SITE_OTHER): Payer: Self-pay

## 2011-12-16 NOTE — Telephone Encounter (Signed)
LMOM for pt to call me about path report per Dr Dwain Sarna.

## 2011-12-17 ENCOUNTER — Other Ambulatory Visit: Payer: Self-pay | Admitting: Orthopedic Surgery

## 2011-12-17 ENCOUNTER — Encounter (HOSPITAL_BASED_OUTPATIENT_CLINIC_OR_DEPARTMENT_OTHER): Payer: Self-pay | Admitting: *Deleted

## 2011-12-17 NOTE — Telephone Encounter (Signed)
Pt returned my call. I notified the pt of her path to be benign per Dr Dwain Sarna.

## 2011-12-17 NOTE — Progress Notes (Signed)
Pt her 2 weeks ago for br bx-did well-no labs needed

## 2011-12-19 NOTE — H&P (Signed)
  Laura Alexander is an 46 y.o. female.   Chief Complaint: c/o chronic and progressive numbness and tingling of the left hand HPI: Laura Alexander is a 46 year-old employee of [employer].  She is a Control and instrumentation engineer.  She is right-hand dominant.  She has had a history of bilateral arm pain, arm numbness and hand tingling dating back since February, 2013.  She has been worked up and had electrodiagnostic studies performed at [facility] which revealed evidence of a left carpal tunnel syndrome and a clinical diagnosis of mild right carpal tunnel syndrome was established.  Past Medical History  Diagnosis Date  . Carpal tunnel syndrome   . No pertinent past medical history     Past Surgical History  Procedure Date  . Abdominal hysterectomy 12/2007    Partial  . Laser ablation of the cervix 04/1989    stage 4 dysplasia  . Mass excision 12/12/2011    Procedure: EXCISION MASS;  Surgeon: Emelia Loron, MD;  Location: [facility];  Service: General;  Laterality: Right;  Right breast masses excisions times two, right breast wire localization biopsy  . Breast biopsy 12/12/2011    Procedure: BREAST BIOPSY WITH NEEDLE LOCALIZATION;  Surgeon: Emelia Loron, MD;  Location: [facility];  Service: General;  Laterality: Right;    No family history on file. Social History:  reports that she has been smoking.  She has never used smokeless tobacco. She reports that she drinks alcohol. She reports that she does not use illicit drugs.  Allergies:  Allergies  Allergen Reactions  . Eggs Or Egg-Derived Products Anaphylaxis    Tongue, throat, and ears itch  . Codeine Nausea And Vomiting    No prescriptions prior to admission    No results found for this or any previous visit (from the past 48 hour(s)).  No results found.   Pertinent items are noted in HPI.  There were no vitals taken for this visit.  General appearance: alert Head: Normocephalic, without  obvious abnormality Neck: supple, symmetrical, trachea midline Resp: clear to auscultation bilaterally Cardio: regular rate and rhythm GI: normal findings: bowel sounds normal Extremities:  Her cervical range of motion is markedly restricted. She has forward flexion, her chin 2 inches from her chest, extension 20 degrees limited by pain, rotation right and left 45 degrees with obvious pain and wincing. She has discomfort lateral bending.  She has full range of motion of her shoulders, elbows and wrists.  She has clinical signs of carpal tunnel syndrome left worse than right.  She has no sign of stenosing tenosynovitis.  I did not strength test her due to her level of discomfort.   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   Her electrodiagnostic studies were reviewed.  She had significant left carpal tunnel syndrome and likely, on a clinical basis, right carpal tunnel syndrome.     Pulses: 2+ and symmetric Skin: mobility and turgor normal Neurologic: Grossly normal    Assessment/Plan Impression: Left CTS  Plan: To the OR for left CTR.The procedure, risks,benefits and post-op course were discussed with the patient at length and they were in agreement with the plan.   DASNOIT,Ambrosio Reuter J 12/19/2011, 9:57 PM     H&P documentation: 12/20/2011  -History and Physical Reviewed  -Patient has been re-examined  -No change in the plan of care  Wyn Forster, MD

## 2011-12-20 ENCOUNTER — Encounter (HOSPITAL_BASED_OUTPATIENT_CLINIC_OR_DEPARTMENT_OTHER): Payer: Self-pay | Admitting: Anesthesiology

## 2011-12-20 ENCOUNTER — Ambulatory Visit (HOSPITAL_BASED_OUTPATIENT_CLINIC_OR_DEPARTMENT_OTHER)
Admission: RE | Admit: 2011-12-20 | Discharge: 2011-12-20 | Disposition: A | Discharge status: Home / Self Care | Payer: BC Managed Care – PPO | Source: Ambulatory Visit | Attending: Orthopedic Surgery | Admitting: Orthopedic Surgery

## 2011-12-20 ENCOUNTER — Ambulatory Visit (HOSPITAL_BASED_OUTPATIENT_CLINIC_OR_DEPARTMENT_OTHER): Payer: BC Managed Care – PPO | Admitting: Anesthesiology

## 2011-12-20 ENCOUNTER — Encounter (HOSPITAL_BASED_OUTPATIENT_CLINIC_OR_DEPARTMENT_OTHER): Payer: Self-pay | Admitting: *Deleted

## 2011-12-20 ENCOUNTER — Encounter (HOSPITAL_BASED_OUTPATIENT_CLINIC_OR_DEPARTMENT_OTHER)
Admission: RE | Disposition: A | Discharge status: Home / Self Care | Payer: Self-pay | Source: Ambulatory Visit | Attending: Orthopedic Surgery

## 2011-12-20 DIAGNOSIS — G5600 Carpal tunnel syndrome, unspecified upper limb: Secondary | ICD-10-CM | POA: Insufficient documentation

## 2011-12-20 DIAGNOSIS — F17200 Nicotine dependence, unspecified, uncomplicated: Secondary | ICD-10-CM | POA: Insufficient documentation

## 2011-12-20 HISTORY — PX: CARPAL TUNNEL RELEASE: SHX101

## 2011-12-20 SURGERY — Surgical Case
Anesthesia: *Unknown

## 2011-12-20 SURGERY — CARPAL TUNNEL RELEASE
Anesthesia: General | Site: Hand | Laterality: Left | Wound class: Clean

## 2011-12-20 MED ORDER — LACTATED RINGERS IV SOLN
INTRAVENOUS | Status: DC
  Administered 2011-12-20 (×2): via INTRAVENOUS

## 2011-12-20 MED ORDER — LIDOCAINE HCL 2 % IJ SOLN
INTRAMUSCULAR | Status: DC | PRN
  Administered 2011-12-20: 2 mL

## 2011-12-20 MED ORDER — OXYCODONE-ACETAMINOPHEN 5-325 MG PO TABS: ORAL_TABLET | ORAL | Status: DC

## 2011-12-20 MED ORDER — FENTANYL CITRATE 0.05 MG/ML IJ SOLN
INTRAMUSCULAR | Status: DC | PRN
  Administered 2011-12-20: 25 ug via INTRAVENOUS
  Administered 2011-12-20: 50 ug via INTRAVENOUS
  Administered 2011-12-20 (×2): 25 ug via INTRAVENOUS

## 2011-12-20 MED ORDER — ONDANSETRON HCL 4 MG/2ML IJ SOLN
INTRAMUSCULAR | Status: DC | PRN
  Administered 2011-12-20: 4 mg via INTRAVENOUS

## 2011-12-20 MED ORDER — OXYCODONE HCL 5 MG PO TABS
5.0000 mg | ORAL_TABLET | Freq: Once | ORAL | Status: AC | PRN
  Administered 2011-12-20: 5 mg via ORAL

## 2011-12-20 MED ORDER — LIDOCAINE HCL (CARDIAC) 20 MG/ML IV SOLN
INTRAVENOUS | Status: DC | PRN
  Administered 2011-12-20: 60 mg via INTRAVENOUS

## 2011-12-20 MED ORDER — PROPOFOL 10 MG/ML IV BOLUS
INTRAVENOUS | Status: DC | PRN
  Administered 2011-12-20: 200 mg via INTRAVENOUS

## 2011-12-20 MED ORDER — MIDAZOLAM HCL 5 MG/5ML IJ SOLN
INTRAMUSCULAR | Status: DC | PRN
  Administered 2011-12-20: 1 mg via INTRAVENOUS

## 2011-12-20 MED ORDER — OXYCODONE HCL 5 MG/5ML PO SOLN: 5.0000 mg | Freq: Once | ORAL | Status: AC | PRN

## 2011-12-20 MED ORDER — CHLORHEXIDINE GLUCONATE 4 % EX LIQD: 60.0000 mL | Freq: Once | CUTANEOUS | Status: DC

## 2011-12-20 MED ORDER — DEXAMETHASONE SODIUM PHOSPHATE 10 MG/ML IJ SOLN
INTRAMUSCULAR | Status: DC | PRN
  Administered 2011-12-20: 10 mg via INTRAVENOUS

## 2011-12-20 MED ORDER — HYDROMORPHONE HCL PF 1 MG/ML IJ SOLN
0.2500 mg | INTRAMUSCULAR | Status: DC | PRN
  Administered 2011-12-20 (×2): 0.5 mg via INTRAVENOUS

## 2011-12-20 MED ORDER — DIPHENHYDRAMINE HCL 50 MG/ML IJ SOLN
INTRAMUSCULAR | Status: DC | PRN
  Administered 2011-12-20: 12.5 mg via INTRAVENOUS

## 2011-12-20 SURGICAL SUPPLY — 39 items
BANDAGE ADHESIVE 1X3 (GAUZE/BANDAGES/DRESSINGS) IMPLANT
BANDAGE ELASTIC 3 VELCRO ST LF (GAUZE/BANDAGES/DRESSINGS) ×2 IMPLANT
BLADE SURG 15 STRL LF DISP TIS (BLADE) ×1 IMPLANT
BLADE SURG 15 STRL SS (BLADE) ×1
BNDG ESMARK 4X9 LF (GAUZE/BANDAGES/DRESSINGS) ×2 IMPLANT
BRUSH SCRUB EZ PLAIN DRY (MISCELLANEOUS) ×2 IMPLANT
CLOTH BEACON ORANGE TIMEOUT ST (SAFETY) ×2 IMPLANT
CORDS BIPOLAR (ELECTRODE) IMPLANT
COVER MAYO STAND STRL (DRAPES) ×2 IMPLANT
COVER TABLE BACK 60X90 (DRAPES) ×2 IMPLANT
CUFF TOURNIQUET SINGLE 18IN (TOURNIQUET CUFF) ×2 IMPLANT
DECANTER SPIKE VIAL GLASS SM (MISCELLANEOUS) ×2 IMPLANT
DRAPE EXTREMITY T 121X128X90 (DRAPE) ×2 IMPLANT
DRAPE SURG 17X23 STRL (DRAPES) ×2 IMPLANT
GLOVE BIOGEL M STRL SZ7.5 (GLOVE) ×2 IMPLANT
GLOVE BIOGEL PI IND STRL 7.0 (GLOVE) ×1 IMPLANT
GLOVE BIOGEL PI INDICATOR 7.0 (GLOVE) ×1
GLOVE ECLIPSE 6.5 STRL STRAW (GLOVE) ×2 IMPLANT
GLOVE EXAM NITRILE EXT CUFF MD (GLOVE) ×2 IMPLANT
GLOVE ORTHO TXT STRL SZ7.5 (GLOVE) ×2 IMPLANT
GOWN PREVENTION PLUS XLARGE (GOWN DISPOSABLE) ×2 IMPLANT
GOWN PREVENTION PLUS XXLARGE (GOWN DISPOSABLE) ×4 IMPLANT
NEEDLE 27GAX1X1/2 (NEEDLE) ×2 IMPLANT
PACK BASIN DAY SURGERY FS (CUSTOM PROCEDURE TRAY) ×2 IMPLANT
PAD CAST 3X4 CTTN HI CHSV (CAST SUPPLIES) ×1 IMPLANT
PADDING CAST ABS 4INX4YD NS (CAST SUPPLIES) ×1
PADDING CAST ABS COTTON 4X4 ST (CAST SUPPLIES) ×1 IMPLANT
PADDING CAST COTTON 3X4 STRL (CAST SUPPLIES) ×1
SPLINT PLASTER CAST XFAST 3X15 (CAST SUPPLIES) ×5 IMPLANT
SPLINT PLASTER XTRA FASTSET 3X (CAST SUPPLIES) ×5
SPONGE GAUZE 4X4 12PLY (GAUZE/BANDAGES/DRESSINGS) ×2 IMPLANT
STOCKINETTE 4X48 STRL (DRAPES) ×2 IMPLANT
STRIP CLOSURE SKIN 1/2X4 (GAUZE/BANDAGES/DRESSINGS) ×2 IMPLANT
SUT PROLENE 3 0 PS 2 (SUTURE) ×2 IMPLANT
SYR 3ML 23GX1 SAFETY (SYRINGE) IMPLANT
SYR CONTROL 10ML LL (SYRINGE) IMPLANT
TRAY DSU PREP LF (CUSTOM PROCEDURE TRAY) ×2 IMPLANT
UNDERPAD 30X30 INCONTINENT (UNDERPADS AND DIAPERS) ×2 IMPLANT
WATER STERILE IRR 1000ML POUR (IV SOLUTION) ×2 IMPLANT

## 2011-12-20 NOTE — Transfer of Care (Signed)
Immediate Anesthesia Transfer of Care Note  Patient: Laura Alexander  Procedure(s) Performed: Procedure(s) (LRB) with comments: CARPAL TUNNEL RELEASE (Left) - left carpal tunnel release  Patient Location: PACU  Anesthesia Type: General  Level of Consciousness: awake, alert , oriented and patient cooperative  Airway & Oxygen Therapy: Patient Spontanous Breathing and Patient connected to face mask oxygen  Post-op Assessment: Report given to PACU RN and Post -op Vital signs reviewed and stable  Post vital signs: Reviewed and stable  Complications: No apparent anesthesia complications

## 2011-12-20 NOTE — Brief Op Note (Signed)
12/20/2011  10:06 AM  PATIENT:  Laura Alexander  46 y.o. female  PRE-OPERATIVE DIAGNOSIS:  left carpal tunnel syndrome  POST-OPERATIVE DIAGNOSIS:  left carpal tunnel syndrome  PROCEDURE:  Procedure(s) (LRB) with comments: CARPAL TUNNEL RELEASE (Left) - left carpal tunnel release  SURGEON:  Surgeon(s) and Role:    * Wyn Forster., MD - Primary  PHYSICIAN ASSISTANT:   ASSISTANTS:Lavana Huckeba Dasnoit,P.A-C    ANESTHESIA:   general  EBL:  Total I/O In: 1200 [I.V.:1200] Out: -   BLOOD ADMINISTERED:none  DRAINS: none   LOCAL MEDICATIONS USED:  XYLOCAINE   SPECIMEN:  No Specimen  DISPOSITION OF SPECIMEN:  N/A  COUNTS:  YES  TOURNIQUET:   Total Tourniquet Time Documented: Upper Arm (Left) - 9 minutes  DICTATION: .Other Dictation: Dictation Number [id number]  PLAN OF CARE: Discharge to home after PACU  PATIENT DISPOSITION:  PACU - hemodynamically stable.

## 2011-12-20 NOTE — Op Note (Signed)
[id number]  

## 2011-12-20 NOTE — Anesthesia Procedure Notes (Signed)
Procedure Name: LMA Insertion Date/Time: 12/20/2011 9:34 AM Performed by: Antion Andres D Pre-anesthesia Checklist: Patient identified, Emergency Drugs available, Suction available and Patient being monitored Patient Re-evaluated:Patient Re-evaluated prior to inductionOxygen Delivery Method: Circle System Utilized Preoxygenation: Pre-oxygenation with 100% oxygen Intubation Type: IV induction Ventilation: Mask ventilation without difficulty LMA: LMA inserted LMA Size: 4.0 Number of attempts: 1 Airway Equipment and Method: bite block Placement Confirmation: positive ETCO2 Tube secured with: Tape Dental Injury: Teeth and Oropharynx as per pre-operative assessment

## 2011-12-20 NOTE — Anesthesia Postprocedure Evaluation (Signed)
  Anesthesia Post-op Note  Patient: Laura Alexander  Procedure(s) Performed: Procedure(s) (LRB) with comments: CARPAL TUNNEL RELEASE (Left) - left carpal tunnel release  Patient Location: PACU  Anesthesia Type: General  Level of Consciousness: awake and alert   Airway and Oxygen Therapy: Patient Spontanous Breathing  Post-op Pain: mild  Post-op Assessment: Post-op Vital signs reviewed, Patient's Cardiovascular Status Stable, Respiratory Function Stable, Patent Airway and No signs of Nausea or vomiting  Post-op Vital Signs: Reviewed and stable  Complications: No apparent anesthesia complications

## 2011-12-20 NOTE — Anesthesia Preprocedure Evaluation (Signed)
Anesthesia Evaluation  Patient identified by MRN, date of birth, ID band Patient awake    Reviewed: Allergy & Precautions, H&P , NPO status , Patient's Chart, lab work & pertinent test results  Airway Mallampati: II TM Distance: >3 FB Neck ROM: Full    Dental No notable dental hx. (+) Teeth Intact and Dental Advisory Given   Pulmonary neg pulmonary ROS, Current Smoker,  breath sounds clear to auscultation  Pulmonary exam normal       Cardiovascular negative cardio ROS  Rhythm:Regular Rate:Normal     Neuro/Psych negative neurological ROS  negative psych ROS   GI/Hepatic negative GI ROS, Neg liver ROS,   Endo/Other  negative endocrine ROS  Renal/GU negative Renal ROS  negative genitourinary   Musculoskeletal   Abdominal   Peds  Hematology negative hematology ROS (+)   Anesthesia Other Findings   Reproductive/Obstetrics negative OB ROS                           Anesthesia Physical Anesthesia Plan  ASA: II  Anesthesia Plan: General   Post-op Pain Management:    Induction: Intravenous  Airway Management Planned: LMA  Additional Equipment:   Intra-op Plan:   Post-operative Plan: Extubation in OR  Informed Consent: I have reviewed the patients History and Physical, chart, labs and discussed the procedure including the risks, benefits and alternatives for the proposed anesthesia with the patient or authorized representative who has indicated his/her understanding and acceptance.   Dental advisory given  Plan Discussed with: CRNA  Anesthesia Plan Comments:         Anesthesia Quick Evaluation

## 2011-12-23 ENCOUNTER — Ambulatory Visit (INDEPENDENT_AMBULATORY_CARE_PROVIDER_SITE_OTHER): Payer: BC Managed Care – PPO | Admitting: General Surgery

## 2011-12-23 ENCOUNTER — Telehealth (INDEPENDENT_AMBULATORY_CARE_PROVIDER_SITE_OTHER): Payer: Self-pay | Admitting: General Surgery

## 2011-12-23 ENCOUNTER — Telehealth (INDEPENDENT_AMBULATORY_CARE_PROVIDER_SITE_OTHER): Payer: Self-pay

## 2011-12-23 ENCOUNTER — Encounter (INDEPENDENT_AMBULATORY_CARE_PROVIDER_SITE_OTHER): Payer: Self-pay | Admitting: General Surgery

## 2011-12-23 VITALS — BP 138/70 | HR 76 | Temp 97.0°F | Resp 16 | Ht 67.0 in | Wt 145.0 lb

## 2011-12-23 DIAGNOSIS — Z09 Encounter for follow-up examination after completed treatment for conditions other than malignant neoplasm: Secondary | ICD-10-CM

## 2011-12-23 LAB — POCT HEMOGLOBIN-HEMACUE: Hemoglobin: 11.9 g/dL — ABNORMAL LOW (ref 12.0–15.0)

## 2011-12-23 NOTE — Progress Notes (Signed)
Subjective:     Patient ID: Laura Alexander, female   DOB: 1965-06-02, 46 y.o.   MRN: [id number]  HPI This is a 46 year old female who I did an excision of 2 right breast masses recently. One of these was a fibroadenoma with associated PASH. The other was a radial scar with some calcification. She returns today with a complaint of some significant bruising is concerned about having an infection of that breast.  Review of Systems     Objective:   Physical Exam  Pulmonary/Chest:         Assessment:     Status post excision of 2 right breast masses    Plan:     I think she just has a fairly decent sized hematoma but there is no evidence of any infection currently. We discussed her pathology today. This should all resolve on its own if she can come back and see me in 2 weeks. She's going to continue her sports bra as much as possible.

## 2011-12-23 NOTE — Telephone Encounter (Signed)
Pt calling to report some bloody drainage from incision.  It appears old and dark in nature.  No pain, signs of infection or fever to report.  She will monitor for bright red blood, continuous oozing of same, or symptoms of infection.

## 2011-12-23 NOTE — Op Note (Signed)
NAMEWilla Alexander             ACCOUNT NO.:  [id number]  MEDICAL RECORD NO.:  [id number]  LOCATION:                                 FACILITY:  PHYSICIAN:  Katy Fitch. Hung Rhinesmith, M.D. 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  OPERATION:  Release of left transverse carpal ligament.  OPERATING SURGEON:  Katy Fitch. Seanpatrick Maisano, M.D.  ASSISTANT:  Marveen Reeks Dasnoit, PA-C  ANESTHESIA:  General by LMA.  SUPERVISING ANESTHESIOLOGIST:  Zenon Mayo, MD  INDICATIONS:  Laura Alexander is a 46 year old woman referred by the physicians in [facility] for evaluation and management of bilateral carpal tunnel syndrome.  Clinical examination suggested significant cervical degenerative disk disease and bilateral carpal tunnel syndrome. Electrodiagnostic studies completed in [facility] revealed moderately severe left carpal tunnel syndrome and mild right carpal tunnel syndrome.  Due to multilevel cervical degenerative disk disease primarily at C5-6 and C6-7, I referred her to the [facility] here in [place] for a neurosurgical consult and Physical Medicine consult.  She was seen by Dr. Ollen Bowl of Physical Medicine and Dr. Lovell Sheehan of Neurosurgery.  Dr. Lovell Sheehan recommended proceeding with 2-level anterior cervical diskectomy and interbody fusion at C5-6 and C6-7.  After informed consent, she returns at this time for release of her left transverse carpal ligament prior to proceeding with cervical surgery.  PROCEDURE:  Laura Alexander was interviewed in the holding area.  Her __________ was noted.  A proper surgical identification site protocol was followed, marking her left hand.  She was interviewed by Dr.  Sampson Goon of Anesthesia and advised to proceed with general anesthesia by LMA technique.  Under Dr. Jarrett Ables direct supervision, general anesthesia by LMA technique was induced in room #1 of the [facility] followed by routine Betadine scrub and paint of the left upper extremity.  Following exsanguination of the left an arm with Esmarch bandage, the arterial tourniquet was inflated to 220 mmHg.  Following routine surgical time-out, the procedure commenced with a short incision in the line of the ring finger in the palm.  Subcutaneous tissues were carefully divided revealing the palmar fascia.  This was split longitudinally to reveal the common sensory branch of the median nerve. These were followed back to the transverse carpal ligament, which was gently isolated from the median nerve with a Insurance risk surveyor.  Transverse carpal ligament was released along its ulnar border extending into the distal forearm.  This widely opened the carpal canal.  No masses or other predicaments were noted.  Bleeding points along the margin of the released ligament were electrocauterized with bipolar current followed by repair of the skin with intradermal 3-0 Prolene suture.  A compressive dressing was applied with a volar plaster splint maintaining the wrist in 15 degrees of dorsiflexion.  For aftercare, [name] is provided a prescription for Percocet 5 mg 1 p.o. q.4-6 hours p.r.n. pain, 20 tablets without refill.     Katy Fitch Venesha Petraitis, M.D.  RVS/MEDQ  D:  12/20/2011  T:  12/21/2011  Job:  [id number]

## 2011-12-24 ENCOUNTER — Encounter (HOSPITAL_BASED_OUTPATIENT_CLINIC_OR_DEPARTMENT_OTHER): Payer: Self-pay | Admitting: Orthopedic Surgery

## 2012-01-06 ENCOUNTER — Encounter (INDEPENDENT_AMBULATORY_CARE_PROVIDER_SITE_OTHER): Payer: Self-pay | Admitting: General Surgery

## 2012-01-06 ENCOUNTER — Ambulatory Visit (INDEPENDENT_AMBULATORY_CARE_PROVIDER_SITE_OTHER): Payer: BC Managed Care – PPO | Admitting: General Surgery

## 2012-01-06 VITALS — BP 146/92 | HR 112 | Temp 98.7°F | Resp 24 | Ht 67.0 in | Wt 145.8 lb

## 2012-01-06 DIAGNOSIS — Z09 Encounter for follow-up examination after completed treatment for conditions other than malignant neoplasm: Secondary | ICD-10-CM

## 2012-01-06 MED ORDER — OXYCODONE-ACETAMINOPHEN 5-325 MG PO TABS: ORAL_TABLET | ORAL | Status: DC

## 2012-01-06 MED ORDER — DOXYCYCLINE HYCLATE 100 MG PO TABS: 100.0000 mg | ORAL_TABLET | Freq: Two times a day (BID) | ORAL | Status: DC

## 2012-01-07 NOTE — Progress Notes (Signed)
Subjective:     Patient ID: Laura Alexander, female   DOB: 1965/08/17, 46 y.o.   MRN: [id number]  HPI This is a 46 year old female who I did an excision of 2 right breast masses recently. One of these was a fibroadenoma with associated PASH. The other was a radial scar with some calcification.I saw HER-2 weeks ago with a concern of an infection in the breast. This appeared to just be a hematoma at that point in time. She has also had her carpal tunnel surgery since then as well. She returns today with pain in her right breast as well as drainage from her periareolar incision. She denies fevers.   Review of Systems     Objective:   Physical Exam She has a hematoma in the inferior aspect of her right breast. The inferior incision is clean without any infection. There is some erythema as well as drainage of purulent fluid from the periareolar incision.    Assessment:     Status post breast biopsy x2 with wound infection    Plan:     I opened this is much if she would allow me today. There was purulence associated with the periareolar incision. I evacuated this and packed it with gauze. I will also start her on antibiotics. I told her that we may need to open this up more but we would try this for now. She is going to see me later this week again.

## 2012-01-10 ENCOUNTER — Encounter (INDEPENDENT_AMBULATORY_CARE_PROVIDER_SITE_OTHER): Payer: Self-pay | Admitting: General Surgery

## 2012-01-10 ENCOUNTER — Ambulatory Visit (INDEPENDENT_AMBULATORY_CARE_PROVIDER_SITE_OTHER): Payer: BC Managed Care – PPO | Admitting: General Surgery

## 2012-01-10 VITALS — BP 126/78 | HR 88 | Temp 98.2°F | Resp 16 | Ht 67.0 in | Wt 147.8 lb

## 2012-01-10 DIAGNOSIS — Z09 Encounter for follow-up examination after completed treatment for conditions other than malignant neoplasm: Secondary | ICD-10-CM

## 2012-01-10 NOTE — Progress Notes (Signed)
Subjective:     Patient ID: Laura Alexander, female   DOB: December 06, 1965, 46 y.o.   MRN: [id number]  HPI 46 year old female status post excision of 2 right breast lesions. She had an infection with periodic drainage of the peri-areolar one that I saw earlier in the week. I drained this and packed it and put her on antibiotics. She returns today. She is feeling much better. The drainage is decreased and is more clear. The packing had fallen out. She denies any fevers.  Review of Systems     Objective:   Physical Exam Right breast with the inferior incision without infection, although the cellulitis has cleared now Periareolar incision with drainage of some mildly. The fluid but this is much decreased. I packed this again today.    Assessment:     Wound infection status post excision of right breast lesions    Plan:     This looks like it is improving. She's going to complete her antibiotics. I packed this again today. I'll have her come back next week.

## 2012-01-17 ENCOUNTER — Telehealth (INDEPENDENT_AMBULATORY_CARE_PROVIDER_SITE_OTHER): Payer: Self-pay

## 2012-01-17 ENCOUNTER — Ambulatory Visit (INDEPENDENT_AMBULATORY_CARE_PROVIDER_SITE_OTHER): Payer: BC Managed Care – PPO | Admitting: General Surgery

## 2012-01-17 ENCOUNTER — Encounter (INDEPENDENT_AMBULATORY_CARE_PROVIDER_SITE_OTHER): Payer: Self-pay | Admitting: General Surgery

## 2012-01-17 VITALS — BP 130/80 | HR 110 | Temp 98.0°F | Resp 18 | Ht 67.0 in | Wt 146.8 lb

## 2012-01-17 DIAGNOSIS — T8140XA Infection following a procedure, unspecified, initial encounter: Secondary | ICD-10-CM

## 2012-01-17 DIAGNOSIS — T8149XA Infection following a procedure, other surgical site, initial encounter: Secondary | ICD-10-CM

## 2012-01-17 DIAGNOSIS — Z09 Encounter for follow-up examination after completed treatment for conditions other than malignant neoplasm: Secondary | ICD-10-CM

## 2012-01-17 MED ORDER — OXYCODONE-ACETAMINOPHEN 5-325 MG PO TABS: ORAL_TABLET | ORAL | Status: DC

## 2012-01-17 MED ORDER — DOXYCYCLINE HYCLATE 100 MG PO TABS: 100.0000 mg | ORAL_TABLET | Freq: Two times a day (BID) | ORAL | Status: DC

## 2012-01-17 NOTE — Telephone Encounter (Signed)
The patient called because she forgot to mention at her visit today that she is scheduled for a double cervical fusion next Thursday.  She has been treated here for a breast infection and is still on abx.  She wants to make sure she is clear for surgery.  I spoke to [place] who was working with Dr Dwain Sarna today.  She says her infection is gone but they want her to complete her course of abx to be safe.  She will double check with Dr Dwain Sarna but she should be clear to proceed with surgery.  I told the pt and said Penni Bombard will call back if there is any new information.  I also told her to inform her neurosurgeon to give him a heads up what she has been through to make sure he is ok with proceeding.

## 2012-01-17 NOTE — Progress Notes (Signed)
Subjective:     Patient ID: Laura Alexander, female   DOB: 01-20-1966, 46 y.o.   MRN: [id number]  HPI 46 year old female status post excision of 2 right breast lesions. She had an infection with periodic drainage of the peri-areolar one that I saw earlier in the week. I drained this and packed it and put her on antibiotics. She returns today. She is feeling much better.she has minimal clear drainage from this area. She still has some pain associated with that. But this is all getting better.   Review of Systems     Objective:   Physical Exam Right breast with healing incisions and minimal clear drainage. There is no further evidence of any infections.    Assessment:     Status post excision of 2 right breast masses with wound infection    Plan:     This is all improving. I will keep her on one more week of antibiotics and I will plan on seeing her in 2 weeks.

## 2012-01-23 HISTORY — PX: CERVICAL FUSION: SHX112

## 2012-01-31 ENCOUNTER — Encounter (INDEPENDENT_AMBULATORY_CARE_PROVIDER_SITE_OTHER): Payer: BC Managed Care – PPO | Admitting: General Surgery

## 2012-02-03 ENCOUNTER — Encounter (INDEPENDENT_AMBULATORY_CARE_PROVIDER_SITE_OTHER): Payer: Self-pay | Admitting: General Surgery

## 2012-02-03 ENCOUNTER — Ambulatory Visit (INDEPENDENT_AMBULATORY_CARE_PROVIDER_SITE_OTHER): Payer: BC Managed Care – PPO | Admitting: General Surgery

## 2012-02-03 VITALS — BP 122/78 | HR 68 | Temp 97.8°F | Resp 12 | Ht 67.0 in | Wt 146.0 lb

## 2012-02-03 DIAGNOSIS — Z09 Encounter for follow-up examination after completed treatment for conditions other than malignant neoplasm: Secondary | ICD-10-CM

## 2012-02-03 NOTE — Progress Notes (Signed)
Subjective:     Patient ID: Laura Alexander, female   DOB: 09/23/1965, 46 y.o.   MRN: [id number]  HPI This is a 46 year old female I excised 2 right breast masses are both benign. She had a postoperative infection which is now healed.  Review of Systems     Objective:   Physical Exam Well-healed right breast incisions with no evidence of infection    Assessment:     Status post right breast mass excisional biopsy x2 Surgical site infection, now resolved    Plan:     The infection is now resolved. She is otherwise doing very well. I gave her the name of a primary care physician in town. I. Recommended her continuing her normal breast cancer screening. I'll see her back as needed.

## 2012-02-03 NOTE — Patient Instructions (Signed)

## 2012-03-23 ENCOUNTER — Other Ambulatory Visit: Payer: Self-pay | Admitting: Orthopedic Surgery

## 2012-03-24 ENCOUNTER — Encounter (HOSPITAL_BASED_OUTPATIENT_CLINIC_OR_DEPARTMENT_OTHER): Payer: Self-pay | Admitting: *Deleted

## 2012-03-24 NOTE — Progress Notes (Signed)
Pt here for lt ctr-9/13-then had cervical fusion 10/13 Still having neck pain

## 2012-03-25 ENCOUNTER — Encounter (HOSPITAL_BASED_OUTPATIENT_CLINIC_OR_DEPARTMENT_OTHER): Payer: Self-pay | Admitting: Orthopedic Surgery

## 2012-03-25 ENCOUNTER — Encounter (HOSPITAL_BASED_OUTPATIENT_CLINIC_OR_DEPARTMENT_OTHER): Payer: Self-pay | Admitting: Certified Registered Nurse Anesthetist

## 2012-03-25 ENCOUNTER — Encounter (HOSPITAL_BASED_OUTPATIENT_CLINIC_OR_DEPARTMENT_OTHER)
Admission: RE | Disposition: A | Discharge status: Home / Self Care | Payer: Self-pay | Source: Ambulatory Visit | Attending: Orthopedic Surgery

## 2012-03-25 ENCOUNTER — Encounter (HOSPITAL_BASED_OUTPATIENT_CLINIC_OR_DEPARTMENT_OTHER): Payer: Self-pay | Admitting: Anesthesiology

## 2012-03-25 ENCOUNTER — Ambulatory Visit (HOSPITAL_BASED_OUTPATIENT_CLINIC_OR_DEPARTMENT_OTHER)
Admission: RE | Admit: 2012-03-25 | Discharge: 2012-03-25 | Disposition: A | Discharge status: Home / Self Care | Payer: BC Managed Care – PPO | Source: Ambulatory Visit | Attending: Orthopedic Surgery | Admitting: Orthopedic Surgery

## 2012-03-25 ENCOUNTER — Ambulatory Visit (HOSPITAL_BASED_OUTPATIENT_CLINIC_OR_DEPARTMENT_OTHER): Payer: BC Managed Care – PPO | Admitting: Certified Registered Nurse Anesthetist

## 2012-03-25 DIAGNOSIS — Z885 Allergy status to narcotic agent status: Secondary | ICD-10-CM | POA: Insufficient documentation

## 2012-03-25 DIAGNOSIS — G5600 Carpal tunnel syndrome, unspecified upper limb: Secondary | ICD-10-CM | POA: Insufficient documentation

## 2012-03-25 DIAGNOSIS — Z981 Arthrodesis status: Secondary | ICD-10-CM | POA: Insufficient documentation

## 2012-03-25 DIAGNOSIS — F17200 Nicotine dependence, unspecified, uncomplicated: Secondary | ICD-10-CM | POA: Insufficient documentation

## 2012-03-25 DIAGNOSIS — Z91012 Allergy to eggs: Secondary | ICD-10-CM | POA: Insufficient documentation

## 2012-03-25 DIAGNOSIS — Z90710 Acquired absence of both cervix and uterus: Secondary | ICD-10-CM | POA: Insufficient documentation

## 2012-03-25 HISTORY — PX: CARPAL TUNNEL RELEASE: SHX101

## 2012-03-25 HISTORY — DX: Other chronic pain: G89.29

## 2012-03-25 LAB — POCT HEMOGLOBIN-HEMACUE: Hemoglobin: 12.3 g/dL (ref 12.0–15.0)

## 2012-03-25 SURGERY — CARPAL TUNNEL RELEASE
Anesthesia: Regional | Site: Hand | Laterality: Right | Wound class: Clean

## 2012-03-25 MED ORDER — MIDAZOLAM HCL 5 MG/5ML IJ SOLN
INTRAMUSCULAR | Status: DC | PRN
  Administered 2012-03-25: 2 mg via INTRAVENOUS

## 2012-03-25 MED ORDER — CHLORHEXIDINE GLUCONATE 4 % EX LIQD: 60.0000 mL | Freq: Once | CUTANEOUS | Status: DC

## 2012-03-25 MED ORDER — CEFAZOLIN SODIUM-DEXTROSE 2-3 GM-% IV SOLR
2.0000 g | INTRAVENOUS | Status: AC
  Administered 2012-03-25: 2 g via INTRAVENOUS

## 2012-03-25 MED ORDER — FENTANYL CITRATE 0.05 MG/ML IJ SOLN
INTRAMUSCULAR | Status: DC | PRN
  Administered 2012-03-25: 100 ug via INTRAVENOUS

## 2012-03-25 MED ORDER — LACTATED RINGERS IV SOLN
INTRAVENOUS | Status: DC
  Administered 2012-03-25: 13:00:00 via INTRAVENOUS

## 2012-03-25 MED ORDER — HYDROMORPHONE HCL PF 1 MG/ML IJ SOLN: 0.2500 mg | INTRAMUSCULAR | Status: DC | PRN

## 2012-03-25 MED ORDER — ONDANSETRON HCL 4 MG/2ML IJ SOLN
INTRAMUSCULAR | Status: DC | PRN
  Administered 2012-03-25: 4 mg via INTRAVENOUS

## 2012-03-25 MED ORDER — OXYCODONE HCL 5 MG PO TABS: 5.0000 mg | ORAL_TABLET | Freq: Once | ORAL | Status: DC | PRN

## 2012-03-25 MED ORDER — PROPOFOL 10 MG/ML IV EMUL
INTRAVENOUS | Status: DC | PRN
  Administered 2012-03-25: 100 ug/kg/min via INTRAVENOUS

## 2012-03-25 MED ORDER — OXYCODONE HCL 5 MG/5ML PO SOLN: 5.0000 mg | Freq: Once | ORAL | Status: DC | PRN

## 2012-03-25 MED ORDER — LIDOCAINE HCL (PF) 0.5 % IJ SOLN
INTRAMUSCULAR | Status: DC | PRN
  Administered 2012-03-25: 30 mL via INTRAVENOUS

## 2012-03-25 MED ORDER — ONDANSETRON HCL 4 MG/2ML IJ SOLN: 4.0000 mg | Freq: Once | INTRAMUSCULAR | Status: DC | PRN

## 2012-03-25 MED ORDER — BUPIVACAINE HCL (PF) 0.25 % IJ SOLN
INTRAMUSCULAR | Status: DC | PRN
  Administered 2012-03-25: 7 mL

## 2012-03-25 MED ORDER — HYDROCODONE-ACETAMINOPHEN 7.5-325 MG PO TABS: 1.0000 | ORAL_TABLET | Freq: Four times a day (QID) | ORAL | Status: DC | PRN

## 2012-03-25 SURGICAL SUPPLY — 36 items
BANDAGE GAUZE ELAST BULKY 4 IN (GAUZE/BANDAGES/DRESSINGS) ×2 IMPLANT
BLADE SURG 15 STRL LF DISP TIS (BLADE) ×1 IMPLANT
BLADE SURG 15 STRL SS (BLADE) ×1
BNDG COHESIVE 3X5 TAN STRL LF (GAUZE/BANDAGES/DRESSINGS) ×2 IMPLANT
BNDG ESMARK 4X9 LF (GAUZE/BANDAGES/DRESSINGS) IMPLANT
CHLORAPREP W/TINT 26ML (MISCELLANEOUS) ×2 IMPLANT
CLOTH BEACON ORANGE TIMEOUT ST (SAFETY) ×2 IMPLANT
CORDS BIPOLAR (ELECTRODE) ×2 IMPLANT
COVER MAYO STAND STRL (DRAPES) ×2 IMPLANT
COVER TABLE BACK 60X90 (DRAPES) ×2 IMPLANT
CUFF TOURNIQUET SINGLE 18IN (TOURNIQUET CUFF) ×2 IMPLANT
DRAPE EXTREMITY T 121X128X90 (DRAPE) ×2 IMPLANT
DRAPE SURG 17X23 STRL (DRAPES) ×2 IMPLANT
DRSG KUZMA FLUFF (GAUZE/BANDAGES/DRESSINGS) ×2 IMPLANT
GAUZE XEROFORM 1X8 LF (GAUZE/BANDAGES/DRESSINGS) ×2 IMPLANT
GLOVE BIO SURGEON STRL SZ 6.5 (GLOVE) IMPLANT
GLOVE BIOGEL PI IND STRL 8.5 (GLOVE) ×1 IMPLANT
GLOVE BIOGEL PI INDICATOR 8.5 (GLOVE) ×1
GLOVE SURG ORTHO 8.0 STRL STRW (GLOVE) ×2 IMPLANT
GOWN BRE IMP PREV XXLGXLNG (GOWN DISPOSABLE) ×2 IMPLANT
GOWN PREVENTION PLUS XLARGE (GOWN DISPOSABLE) ×2 IMPLANT
NEEDLE 27GAX1X1/2 (NEEDLE) ×2 IMPLANT
NS IRRIG 1000ML POUR BTL (IV SOLUTION) ×2 IMPLANT
PACK BASIN DAY SURGERY FS (CUSTOM PROCEDURE TRAY) ×2 IMPLANT
PAD CAST 3X4 CTTN HI CHSV (CAST SUPPLIES) ×1 IMPLANT
PADDING CAST ABS 4INX4YD NS (CAST SUPPLIES) ×1
PADDING CAST ABS COTTON 4X4 ST (CAST SUPPLIES) ×1 IMPLANT
PADDING CAST COTTON 3X4 STRL (CAST SUPPLIES) ×1
SPONGE GAUZE 4X4 12PLY (GAUZE/BANDAGES/DRESSINGS) ×2 IMPLANT
STOCKINETTE 4X48 STRL (DRAPES) ×2 IMPLANT
SUT VICRYL 4-0 PS2 18IN ABS (SUTURE) IMPLANT
SUT VICRYL RAPIDE 4/0 PS 2 (SUTURE) ×2 IMPLANT
SYR BULB 3OZ (MISCELLANEOUS) ×2 IMPLANT
SYR CONTROL 10ML LL (SYRINGE) ×2 IMPLANT
TOWEL OR 17X24 6PK STRL BLUE (TOWEL DISPOSABLE) ×2 IMPLANT
UNDERPAD 30X30 INCONTINENT (UNDERPADS AND DIAPERS) ×2 IMPLANT

## 2012-03-25 NOTE — Transfer of Care (Signed)
Immediate Anesthesia Transfer of Care Note  Patient: Laura Alexander  Procedure(s) Performed: Procedure(s) (LRB) with comments: CARPAL TUNNEL RELEASE (Right) - GENERAL, FAB  Patient Location: PACU  Anesthesia Type:Bier block  Level of Consciousness: awake, alert  and oriented  Airway & Oxygen Therapy: Patient Spontanous Breathing  Post-op Assessment: Report given to PACU RN and Post -op Vital signs reviewed and stable  Post vital signs: Reviewed  Complications: No apparent anesthesia complications

## 2012-03-25 NOTE — Anesthesia Preprocedure Evaluation (Addendum)
Anesthesia Evaluation  Patient identified by MRN, date of birth, ID band Patient awake    Reviewed: Allergy & Precautions, H&P , NPO status , Patient's Chart, lab work & pertinent test results  Airway Mallampati: I TM Distance: >3 FB Neck ROM: Full    Dental  (+) Teeth Intact and Dental Advisory Given   Pulmonary  breath sounds clear to auscultation        Cardiovascular Rhythm:Regular Rate:Normal     Neuro/Psych    GI/Hepatic   Endo/Other    Renal/GU      Musculoskeletal   Abdominal   Peds  Hematology   Anesthesia Other Findings   Reproductive/Obstetrics                           Anesthesia Physical Anesthesia Plan  ASA: II  Anesthesia Plan: Bier Block   Post-op Pain Management:    Induction: Intravenous  Airway Management Planned: Simple Face Mask  Additional Equipment:   Intra-op Plan:   Post-operative Plan:   Informed Consent: I have reviewed the patients History and Physical, chart, labs and discussed the procedure including the risks, benefits and alternatives for the proposed anesthesia with the patient or authorized representative who has indicated his/her understanding and acceptance.   Dental advisory given  Plan Discussed with: CRNA, Anesthesiologist and Surgeon  Anesthesia Plan Comments:         Anesthesia Quick Evaluation

## 2012-03-25 NOTE — Brief Op Note (Signed)
03/25/2012  2:17 PM  PATIENT:  Laura Alexander  46 y.o. female  PRE-OPERATIVE DIAGNOSIS:  Right Carpal Tunnel Syndrome.  POST-OPERATIVE DIAGNOSIS:  Right Carpal Tunnel Syndrome.  PROCEDURE:  Procedure(s) (LRB) with comments: CARPAL TUNNEL RELEASE (Right) - GENERAL, FAB  SURGEON:  Surgeon(s) and Role:    * Nicki Reaper, MD - Primary  PHYSICIAN ASSISTANT:   ASSISTANTS: none   ANESTHESIA:   local and regional  EBL:  Total I/O In: 700 [I.V.:700] Out: -   BLOOD ADMINISTERED:none  DRAINS: none   LOCAL MEDICATIONS USED:  MARCAINE     SPECIMEN:  No Specimen  DISPOSITION OF SPECIMEN:  N/A  COUNTS:  YES  TOURNIQUET:  * Missing tourniquet times found for documented tourniquets in log:  [id number] *  DICTATION: .Other Dictation: Dictation Number [id number]  PLAN OF CARE: Discharge to home after PACU  PATIENT DISPOSITION:  PACU - hemodynamically stable.

## 2012-03-25 NOTE — Op Note (Signed)
Dictation Number [id number]

## 2012-03-25 NOTE — H&P (Signed)
[name] is a 46 year old right hand dominant female referred by Dr. Teressa Senter for continued care in his absence. She has bilateral carpal tunnel syndrome. She has undergone a carpal tunnel release on her left side on 12-20-11 by Dr. Teressa Senter. She has had a cervical fusion by Dr. Lovell Sheehan on 10-17 at 2 levels. She is complaining of 7 out of 7 nights of symptoms with numbness and tingling to her fingers. She has been taking Dilaudid and Valium for discomfort following cervical fusion. She has had the numbness and tingling of her hands for slightly over a year including over the past 6 months. She has had nerve conductions done revealing bilateral carpal tunnel syndrome with a motor delay of 4.5 on the left, 4.7 on the right, sensory delay of 2.6 bilaterally. There are no changes in her ulnar nerve on either side.  PAST MEDICAL HISTORY:  She is allergic to Codeine. She has had a partial hysterectomy and laser cervical surgery. She is on Adderall, Tramadol and Dilaudid along with a muscle relaxer.  FAMILY H ISTORY: Unknown in that both parents are alive but she is estranged from them.  SOCIAL HISTORY: She is divorced. She smokes 1 PPD and is advised to quit and the reasons behind this especially with respect to her cervical fusion. She drinks alcohol socially.  REVIEW OF SYSTEMS: Negative. Laura Alexander is an 46 y.o. female.   Chief Complaint: CTS RT HPI: see above  Past Medical History  Diagnosis Date  . No pertinent past medical history   . Carpal tunnel syndrome   . Chronic pain     Past Surgical History  Procedure Date  . Abdominal hysterectomy 12/2007    Partial  . Laser ablation of the cervix 04/1989    stage 4 dysplasia  . Mass excision 12/12/2011    Procedure: EXCISION MASS;  Surgeon: Emelia Loron, MD;  Location: [facility];  Service: General;  Laterality: Right;  Right breast masses excisions times two, right breast wire localization biopsy  . Breast biopsy  12/12/2011    Procedure: BREAST BIOPSY WITH NEEDLE LOCALIZATION;  Surgeon: Emelia Loron, MD;  Location: [facility];  Service: General;  Laterality: Right;  . Carpal tunnel release 12/20/2011    Procedure: CARPAL TUNNEL RELEASE;  Surgeon: Wyn Forster., MD;  Location: [facility];  Service: Orthopedics;  Laterality: Left;  left carpal tunnel release  . Cervical fusion 01/23/12    History reviewed. No pertinent family history. Social History:  reports that she has been smoking.  She has never used smokeless tobacco. She reports that she drinks alcohol. She reports that she does not use illicit drugs.  Allergies:  Allergies  Allergen Reactions  . Eggs Or Egg-Derived Products Anaphylaxis    Tongue, throat, and ears itch  . Codeine Nausea And Vomiting    No prescriptions prior to admission    No results found for this or any previous visit (from the past 48 hour(s)).  No results found.   Pertinent items are noted in HPI.  Height 5\' 7"  (1.702 m), weight 66.225 kg (146 lb).  General appearance: alert, cooperative and appears stated age Head: Normocephalic, without obvious abnormality Neck: no adenopathy Resp: clear to auscultation bilaterally Cardio: regular rate and rhythm, S1, S2 normal, no murmur, click, rub or gallop GI: soft, non-tender; bowel sounds normal; no masses,  no organomegaly Extremities: extremities normal, atraumatic, no cyanosis or edema Pulses: 2+ and symmetric Skin: Skin color, texture,  turgor normal. No rashes or lesions Neurologic: Grossly normal Incision/Wound: na  Assessment/Plan Diagnosis: carpal tunnel syndrome right hand.  She desires having surgical release. The pre, peri and post op course are discussed along with risks and complications. She is aware there is no guarantee with surgery, possibility of infection, recurrence, injury to arteries, nerves and tendons, incomplete relief of symptoms and dystrophy.  She  is advised of the possibility of double crush and incomplete relief of symptoms. She is scheduled for right carpal tunnel release as an outpatient  Laura Alexander R 03/25/2012, 11:32 AM

## 2012-03-25 NOTE — Anesthesia Postprocedure Evaluation (Signed)
  Anesthesia Post-op Note  Patient: Laura Alexander  Procedure(s) Performed: Procedure(s) (LRB) with comments: CARPAL TUNNEL RELEASE (Right) - GENERAL, FAB  Patient Location: PACU  Anesthesia Type:MAC and Bier block  Level of Consciousness: awake, alert  and oriented  Airway and Oxygen Therapy: Patient Spontanous Breathing  Post-op Pain: none  Post-op Assessment: Post-op Vital signs reviewed  Post-op Vital Signs: Reviewed  Complications: No apparent anesthesia complications

## 2012-03-26 ENCOUNTER — Encounter (HOSPITAL_BASED_OUTPATIENT_CLINIC_OR_DEPARTMENT_OTHER): Payer: Self-pay | Admitting: Orthopedic Surgery

## 2012-03-26 NOTE — Op Note (Signed)
NAMEJonelle Alexander             ACCOUNT NO.:  [id number]  MEDICAL RECORD NO.:  [id number]  LOCATION:                                 FACILITY:  PHYSICIAN:  Cindee Salt, M.D.            DATE OF BIRTH:  DATE OF PROCEDURE:  03/25/2012 DATE OF DISCHARGE:                              OPERATIVE REPORT   PREOPERATIVE DIAGNOSIS:  Carpal tunnel syndrome, right hand.  POSTOPERATIVE DIAGNOSIS:  Carpal tunnel syndrome, right hand.  OPERATION:  Decompression of right median nerve.  SURGEON:  Cindee Salt, M.D.  ANESTHESIA:  Forearm-based IV regional with local infiltration.  ANESTHESIOLOGIST:  Sheldon Silvan, M.D.  HISTORY:  The patient is a 46 year old female with a history of bilateral carpal tunnel syndrome.  EMG nerve conduction is positive. She has undergone carpal tunnel release on her left side, is admitted now for carpal tunnel release to her right.  Pre, peri and postoperative diagnoses have been discussed.  She is aware that there is no guarantee with surgery; possibility of infection; recurrence of injury to arteries, nerves, tendons; incomplete relief of symptoms and dystrophy. In the preoperative area, the patient is seen, the extremity marked by both patient and surgeon, and antibiotic given.  PROCEDURE:  The patient was brought to the operating room where a forearm-based IV regional anesthetic was carried out without difficulty. She was prepped using ChloraPrep, supine position with the right arm free.  A 3-minute dry time was allowed.  Time-out taken, confirming the patient and procedure.  A longitudinal incision was made in the palm, carried down through the subcutaneous tissue.  Bleeders were electrocauterized, palmar fascia was split, superficial palmar arch identified.  The flexor tendon to the ring and little finger identified to the ulnar side of the median nerve.  The carpal retinaculum was incised with sharp dissection.  A right-angle and Sewall retractor  were placed between the skin and forearm fascia.  The fascia was released for approximately 1.5 cm proximal to the wrist crease under direct vision. Canal was explored.  Air compression to the nerve was apparent.  No further lesions were identified.  Tenosynovial tissue was moderately thickened.  Motor branch entered into the muscle.  The wound was copiously irrigated with saline.  The skin was closed with interrupted 4- 0 Vicryl Rapide sutures.  A local infiltration was given prior to the procedure and following the procedure with 0.25% Marcaine without epinephrine, 6 mL was used.  A sterile compressive dressing with the fingers free was applied.  On deflation of the tourniquet, all fingers were immediately pinked.  She was taken to the recovery room for observation in satisfactory condition.  She will be discharged to home to return to the [facility] in 1 week, on Vicodin.          ______________________________ Cindee Salt, M.D.     GK/MEDQ  D:  03/25/2012  T:  03/26/2012  Job:  [id number]

## 2012-04-08 HISTORY — PX: CERVICAL FUSION: SHX112

## 2012-08-07 ENCOUNTER — Other Ambulatory Visit: Payer: Self-pay | Admitting: Orthopedic Surgery

## 2012-08-07 DIAGNOSIS — M542 Cervicalgia: Secondary | ICD-10-CM

## 2012-08-10 ENCOUNTER — Telehealth (INDEPENDENT_AMBULATORY_CARE_PROVIDER_SITE_OTHER): Payer: Self-pay

## 2012-08-10 ENCOUNTER — Other Ambulatory Visit (HOSPITAL_COMMUNITY): Payer: Self-pay | Admitting: Orthopedic Surgery

## 2012-08-10 DIAGNOSIS — M25531 Pain in right wrist: Secondary | ICD-10-CM

## 2012-08-13 ENCOUNTER — Ambulatory Visit
Admission: RE | Admit: 2012-08-13 | Discharge: 2012-08-13 | Disposition: A | Discharge status: Home / Self Care | Payer: BC Managed Care – PPO | Source: Ambulatory Visit | Attending: Orthopedic Surgery | Admitting: Orthopedic Surgery

## 2012-08-13 DIAGNOSIS — M542 Cervicalgia: Secondary | ICD-10-CM

## 2012-08-19 ENCOUNTER — Encounter (HOSPITAL_COMMUNITY): Payer: Self-pay

## 2012-08-19 ENCOUNTER — Encounter (HOSPITAL_COMMUNITY)
Admission: RE | Admit: 2012-08-19 | Discharge: 2012-08-19 | Disposition: A | Discharge status: Home / Self Care | Payer: BC Managed Care – PPO | Source: Ambulatory Visit | Attending: Orthopedic Surgery | Admitting: Orthopedic Surgery

## 2012-08-19 DIAGNOSIS — M25531 Pain in right wrist: Secondary | ICD-10-CM

## 2012-08-19 DIAGNOSIS — M25539 Pain in unspecified wrist: Secondary | ICD-10-CM | POA: Insufficient documentation

## 2012-08-19 MED ORDER — TECHNETIUM TC 99M MEDRONATE IV KIT
23.8000 | PACK | Freq: Once | INTRAVENOUS | Status: AC | PRN
  Administered 2012-08-19: 23.8 via INTRAVENOUS

## 2013-01-28 ENCOUNTER — Other Ambulatory Visit: Payer: Self-pay | Admitting: Orthopaedic Surgery

## 2013-01-28 DIAGNOSIS — M549 Dorsalgia, unspecified: Secondary | ICD-10-CM

## 2013-02-05 ENCOUNTER — Ambulatory Visit
Admission: RE | Admit: 2013-02-05 | Discharge: 2013-02-05 | Disposition: A | Discharge status: Home / Self Care | Payer: BC Managed Care – PPO | Source: Ambulatory Visit | Attending: Orthopaedic Surgery | Admitting: Orthopaedic Surgery

## 2013-02-05 VITALS — BP 131/76 | HR 78

## 2013-02-05 DIAGNOSIS — M549 Dorsalgia, unspecified: Secondary | ICD-10-CM

## 2013-02-05 MED ORDER — ONDANSETRON HCL 4 MG/2ML IJ SOLN: 4.0000 mg | Freq: Four times a day (QID) | INTRAMUSCULAR | Status: DC | PRN

## 2013-02-05 MED ORDER — IOHEXOL 300 MG/ML  SOLN
10.0000 mL | Freq: Once | INTRAMUSCULAR | Status: AC | PRN
  Administered 2013-02-05: 10 mL via INTRATHECAL

## 2013-02-05 MED ORDER — ONDANSETRON HCL 4 MG/2ML IJ SOLN
4.0000 mg | Freq: Once | INTRAMUSCULAR | Status: AC
  Administered 2013-02-05: 4 mg via INTRAMUSCULAR

## 2013-02-05 MED ORDER — MEPERIDINE HCL 100 MG/ML IJ SOLN
100.0000 mg | Freq: Once | INTRAMUSCULAR | Status: AC
  Administered 2013-02-05: 100 mg via INTRAMUSCULAR

## 2013-02-05 MED ORDER — DIAZEPAM 5 MG PO TABS
10.0000 mg | ORAL_TABLET | Freq: Once | ORAL | Status: AC
  Administered 2013-02-05: 10 mg via ORAL

## 2013-05-12 NOTE — Telephone Encounter (Signed)
error 

## 2014-05-09 ENCOUNTER — Ambulatory Visit
Admission: RE | Admit: 2014-05-09 | Discharge: 2014-05-09 | Disposition: A | Discharge status: Home / Self Care | Payer: BLUE CROSS/BLUE SHIELD | Source: Ambulatory Visit | Attending: Orthopaedic Surgery | Admitting: Orthopaedic Surgery

## 2014-05-09 ENCOUNTER — Other Ambulatory Visit: Payer: Self-pay | Admitting: Orthopaedic Surgery

## 2014-05-09 DIAGNOSIS — M5136 Other intervertebral disc degeneration, lumbar region: Secondary | ICD-10-CM

## 2014-06-12 ENCOUNTER — Ambulatory Visit (INDEPENDENT_AMBULATORY_CARE_PROVIDER_SITE_OTHER): Payer: BLUE CROSS/BLUE SHIELD | Admitting: Physician Assistant

## 2014-06-12 VITALS — BP 128/82 | HR 109 | Temp 98.2°F | Resp 18 | Ht 66.0 in | Wt 188.6 lb

## 2014-06-12 DIAGNOSIS — S51002A Unspecified open wound of left elbow, initial encounter: Secondary | ICD-10-CM

## 2014-06-12 DIAGNOSIS — Z6830 Body mass index (BMI) 30.0-30.9, adult: Secondary | ICD-10-CM | POA: Insufficient documentation

## 2014-06-12 DIAGNOSIS — Z23 Encounter for immunization: Secondary | ICD-10-CM

## 2014-06-12 NOTE — Patient Instructions (Signed)
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
## 2014-06-12 NOTE — Progress Notes (Signed)
  Medical screening examination/treatment/procedure(s) were performed by non-physician practitioner and as supervising physician I was immediately available for consultation/collaboration.     

## 2014-06-16 ENCOUNTER — Encounter: Payer: Self-pay | Admitting: Physician Assistant

## 2014-06-16 LAB — HIV ANTIBODY: HIV 1 AB: NEGATIVE

## 2014-06-16 LAB — CHG HEPATITIS C AB TEST: HEP C VIRUS AB: NEGATIVE

## 2014-06-22 ENCOUNTER — Ambulatory Visit (INDEPENDENT_AMBULATORY_CARE_PROVIDER_SITE_OTHER): Payer: BLUE CROSS/BLUE SHIELD | Admitting: Physician Assistant

## 2014-06-22 VITALS — BP 128/82 | HR 90 | Temp 98.7°F | Resp 16

## 2014-06-22 DIAGNOSIS — T148 Other injury of unspecified body region: Secondary | ICD-10-CM

## 2014-06-22 DIAGNOSIS — Z4802 Encounter for removal of sutures: Secondary | ICD-10-CM

## 2014-06-22 DIAGNOSIS — L089 Local infection of the skin and subcutaneous tissue, unspecified: Secondary | ICD-10-CM | POA: Diagnosis not present

## 2014-06-22 DIAGNOSIS — T148XXA Other injury of unspecified body region, initial encounter: Principal | ICD-10-CM

## 2014-06-22 MED ORDER — CEFTRIAXONE SODIUM 1 G IJ SOLR
1.0000 g | Freq: Once | INTRAMUSCULAR | Status: AC
  Administered 2014-06-22: 1 g via INTRAMUSCULAR

## 2014-06-22 MED ORDER — DOXYCYCLINE HYCLATE 100 MG PO CAPS: 100.0000 mg | ORAL_CAPSULE | Freq: Two times a day (BID) | ORAL | Status: AC

## 2014-06-22 NOTE — Patient Instructions (Addendum)
Please continue to wash wound with soap and water and keep it covered.   Take the doxycycline to completion for 10 days.  Take with food, non-dairy. Please return if you have increased redness, fever, nausea, vomiting and/or swelling.

## 2014-06-23 NOTE — Progress Notes (Signed)
[facility] [address], [place] [address] [phone]  Date:  06/22/2014   Name:  Laura Alexander   DOB:  [birth year]   MRN:  [id number]  PCP:  Glendon Axe, MD    Chief Complaint: Suture / Staple Removal   History of Present Illness:  Laura Alexander is a 49 y.o. very pleasant female patient who presents with the following:  Patient reports back for suture removal following 10 days of suture placement to laceraiton along left forearm elbow, and just above arm.  She states that there is pain, redness, and pus coming from wound.  She has washed the wound with soap and water once per day.  She states that she thought she felt feverish yesterday, but no nausea, vomiting, dizziness, or diaphoresis.  She states that she can move the elbow and arm, but it hurts to the touch.     Patient Active Problem List   Diagnosis Date Noted  . BMI 30.0-30.9,adult 06/12/2014    Past Medical History  Diagnosis Date  . Carpal tunnel syndrome   . Chronic pain     Past Surgical History  Procedure Laterality Date  . Abdominal hysterectomy  12/2007    Partial  . Laser ablation of the cervix  04/1989    stage 4 dysplasia  . Mass excision  12/12/2011    Procedure: EXCISION MASS;  Surgeon: Rolm Bookbinder, MD;  Location: [place];  Service: General;  Laterality: Right;  Right breast masses excisions times two, right breast wire localization biopsy  . Breast biopsy  12/12/2011    Procedure: BREAST BIOPSY WITH NEEDLE LOCALIZATION;  Surgeon: Rolm Bookbinder, MD;  Location: [place];  Service: General;  Laterality: Right;  . Carpal tunnel release  12/20/2011    Procedure: CARPAL TUNNEL RELEASE;  Surgeon: Cammie Sickle., MD;  Location: [place];  Service: Orthopedics;  Laterality: Left;  left carpal tunnel release  . Cervical fusion  01/23/12  . Carpal tunnel release  03/25/2012    Procedure: CARPAL TUNNEL RELEASE;   Surgeon: Wynonia Sours, MD;  Location: [place];  Service: Orthopedics;  Laterality: Right;  GENERAL, FAB    History  Substance Use Topics  . Smoking status: Current Every Day Smoker -- 1.00 packs/day  . Smokeless tobacco: Never Used  . Alcohol Use: 0.0 oz/week    0 Standard drinks or equivalent per week     Comment: Social    History reviewed. No pertinent family history.  Allergies  Allergen Reactions  . Eggs Or Egg-Derived Products Anaphylaxis    Tongue, throat, and ears itch  . Codeine Nausea And Vomiting    Medication list has been reviewed and updated.  Current Outpatient Prescriptions on File Prior to Visit  Medication Sig Dispense Refill  . diazepam (VALIUM) 5 MG tablet Take 5 mg by mouth every 6 (six) hours as needed.     No current facility-administered medications on file prior to visit.    Review of Systems: ROS otherwise unremarkable unless listed above.    Physical Examination: Filed Vitals:   06/22/14 1408  BP: 128/82  Pulse:   Temp:   Resp:    There were no vitals filed for this visit. There is no weight on file to calculate BMI. Ideal Body Weight:    Physical Exam  Constitutional: She is oriented to person, place, and time. She appears well-developed and well-nourished.  BP 128/82 mmHg  Pulse 90  Temp(Src) 98.7 F (37.1 C) (Oral)  Resp 16  SpO2 97%   Cardiovascular: Normal rate.   Pulses:      Carotid pulses are 2+ on the right side, and 2+ on the left side. Pulmonary/Chest: Effort normal. No respiratory distress.  Musculoskeletal:  No bony tenderness with palpation of arm and good ROM without pain.    Neurological: She is alert and oriented to person, place, and time.  Skin: Skin is warm and dry. There is erythema.  Sutures are in place, with dried exudate surrounding suture puncture site.  Generous swelling and erythema localized to separate wound site with tenderness to palpation.    Psychiatric: She has a normal mood  and affect. Her behavior is normal. Thought content normal.   Sutures removed without complication though tender throughout.  Pus expressed with removing suture.  Wound culture gathered. Rinsed with saline.  Mupirocin ointment placed over the wound and dressing applied.    Assessment and Plan: 49 year old female is here today for suture removal with complication of an infected laceration.  Patient had laceration for over 12 hours, before visiting care, likely culprit to wound complication.   Doxycycline BID for 10 days to cover for MRSA Advised to clean twice per day with soap and water, and recover for 24 hours, then leave open.   rtc if sxs worsen   Infected laceration - Plan: Wound culture, doxycycline (VIBRAMYCIN) 100 MG capsule, cefTRIAXone (ROCEPHIN) injection 1 g  Ivar Drape, PA-C [facility] 3/17/20169:54 AM

## 2014-06-25 LAB — WOUND CULTURE
GRAM STAIN: NONE SEEN
Gram Stain: NONE SEEN

## 2015-08-20 ENCOUNTER — Ambulatory Visit (HOSPITAL_COMMUNITY)
Admission: EM | Admit: 2015-08-20 | Discharge: 2015-08-20 | Disposition: A | Discharge status: Home / Self Care | Payer: 59 | Attending: Emergency Medicine | Admitting: Emergency Medicine

## 2015-08-20 ENCOUNTER — Encounter (HOSPITAL_COMMUNITY): Payer: Self-pay | Admitting: Emergency Medicine

## 2015-08-20 DIAGNOSIS — J3489 Other specified disorders of nose and nasal sinuses: Secondary | ICD-10-CM | POA: Diagnosis not present

## 2015-08-20 DIAGNOSIS — H6691 Otitis media, unspecified, right ear: Secondary | ICD-10-CM | POA: Diagnosis not present

## 2015-08-20 MED ORDER — KETOROLAC TROMETHAMINE 60 MG/2ML IM SOLN
60.0000 mg | Freq: Once | INTRAMUSCULAR | Status: AC
  Administered 2015-08-20: 60 mg via INTRAMUSCULAR

## 2015-08-20 MED ORDER — KETOROLAC TROMETHAMINE 60 MG/2ML IM SOLN
INTRAMUSCULAR | Status: AC
  Filled 2015-08-20: qty 2

## 2015-08-20 MED ORDER — AMOXICILLIN-POT CLAVULANATE 875-125 MG PO TABS: 1.0000 | ORAL_TABLET | Freq: Two times a day (BID) | ORAL | Status: DC

## 2015-08-20 NOTE — ED Provider Notes (Signed)
CSN: [id number]     Arrival date & time 08/20/15  1811 History   First MD Initiated Contact with Patient 08/20/15 1832     Chief Complaint  Patient presents with  . Otalgia  . Facial Pain   (Consider location/radiation/quality/duration/timing/severity/associated sxs/prior Treatment) HPI History obtained from patient:  Pt presents with the cc of: Sinus pain and pressure right ear pain Duration of symptoms: Thursday Treatment prior to arrival: None Context: Sudden onset of symptoms with nasal drainage and right ear ache Other symptoms include: Cough Pain score: 3/4 FAMILY HISTORY: SOCIAL HISTORY: Smoker  Past Medical History  Diagnosis Date  . Carpal tunnel syndrome   . Chronic pain    Past Surgical History  Procedure Laterality Date  . Abdominal hysterectomy  12/2007    Partial  . Laser ablation of the cervix  04/1989    stage 4 dysplasia  . Mass excision  12/12/2011    Procedure: EXCISION MASS;  Surgeon: Rolm Bookbinder, MD;  Location: [place];  Service: General;  Laterality: Right;  Right breast masses excisions times two, right breast wire localization biopsy  . Breast biopsy  12/12/2011    Procedure: BREAST BIOPSY WITH NEEDLE LOCALIZATION;  Surgeon: Rolm Bookbinder, MD;  Location: [place];  Service: General;  Laterality: Right;  . Carpal tunnel release  12/20/2011    Procedure: CARPAL TUNNEL RELEASE;  Surgeon: Cammie Sickle., MD;  Location: [place];  Service: Orthopedics;  Laterality: Left;  left carpal tunnel release  . Cervical fusion  01/23/12  . Carpal tunnel release  03/25/2012    Procedure: CARPAL TUNNEL RELEASE;  Surgeon: Wynonia Sours, MD;  Location: [place];  Service: Orthopedics;  Laterality: Right;  GENERAL, FAB   History reviewed. No pertinent family history. Social History  Substance Use Topics  . Smoking status: Current Every Day Smoker -- 1.00 packs/day  . Smokeless tobacco: Never  Used  . Alcohol Use: 0.0 oz/week    0 Standard drinks or equivalent per week     Comment: Social   OB History    No data available     Review of Systems Positive for right earache and sinus pain and pressure Negative for nausea or vomiting, chest pain, abdominal pain, fever Allergies  Eggs or egg-derived products and Codeine  Home Medications   Prior to Admission medications   Medication Sig Start Date End Date Taking? Authorizing Provider  fexofenadine-pseudoephedrine (ALLEGRA-D 24) 180-240 MG 24 hr tablet Take 1 tablet by mouth daily.   Yes Historical Provider, MD  amoxicillin-clavulanate (AUGMENTIN) 875-125 MG tablet Take 1 tablet by mouth every 12 (twelve) hours. 08/20/15   Konrad Felix, PA  diazepam (VALIUM) 5 MG tablet Take 5 mg by mouth every 6 (six) hours as needed.    Historical Provider, MD   Meds Ordered and Administered this Visit   Medications  ketorolac (TORADOL) injection 60 mg (60 mg Intramuscular Given 08/20/15 1838)    BP 135/79 mmHg  Pulse 93  Temp(Src) 97.9 F (36.6 C) (Oral)  Resp 20  SpO2 99% No data found.   Physical Exam NURSES NOTES AND VITAL SIGNS REVIEWED. CONSTITUTIONAL: Well developed, well nourished, no acute distress HEENT: normocephalic, atraumatic, sinuses are without significant tenderness without bogginess. Right TM is bulging with decreased light reflex and no motion. EYES: Conjunctiva normal NECK:normal ROM, supple, no adenopathy PULMONARY:No respiratory distress, normal effort ABDOMINAL: Soft, ND, NT BS+, No CVAT MUSCULOSKELETAL: Normal ROM of all extremities,  SKIN: warm and dry without rash PSYCHIATRIC: Mood and affect, behavior are normal  ED Course  Procedures (including critical care time)  Labs Review Labs Reviewed - No data to display  Imaging Review No results found.   Visual Acuity Review  Right Eye Distance:   Left Eye Distance:   Bilateral Distance:    Right Eye Near:   Left Eye Near:    Bilateral  Near:      rx Augmentin   MDM   1. Sinus pain   2. Acute right otitis media, recurrence not specified, unspecified otitis media type     Patient is reassured that there are no issues that require transfer to higher level of care at this time or additional tests. Patient is advised to continue home symptomatic treatment. Patient is advised that if there are new or worsening symptoms to attend the emergency department, contact primary care provider, or return to UC. Instructions of care provided discharged home in stable condition.    THIS NOTE WAS GENERATED USING A VOICE RECOGNITION SOFTWARE PROGRAM. ALL REASONABLE EFFORTS  WERE MADE TO PROOFREAD THIS DOCUMENT FOR ACCURACY.  I have verbally reviewed the discharge instructions with the patient. A printed AVS was given to the patient.  All questions were answered prior to discharge.      Konrad Felix, [place] 08/20/15 [phone]

## 2015-08-20 NOTE — Discharge Instructions (Signed)
Otitis Media, Adult Otitis media is redness, soreness, and puffiness (swelling) in the space just behind your eardrum (middle ear). It may be caused by allergies or infection. It often happens along with a cold. HOME CARE  Take your medicine as told. Finish it even if you start to feel better.  Only take over-the-counter or prescription medicines for pain, discomfort, or fever as told by your doctor.  Follow up with your doctor as told. GET HELP IF:  You have otitis media only in one ear, or bleeding from your nose, or both.  You notice a lump on your neck.  You are not getting better in 3-5 days.  You feel worse instead of better. GET HELP RIGHT AWAY IF:   You have pain that is not helped with medicine.  You have puffiness, redness, or pain around your ear.  You get a stiff neck.  You cannot move part of your face (paralysis).  You notice that the bone behind your ear hurts when you touch it. MAKE SURE YOU:   Understand these instructions.  Will watch your condition.  Will get help right away if you are not doing well or get worse.   This information is not intended to replace advice given to you by your health care provider. Make sure you discuss any questions you have with your health care provider.   Document Released: 09/11/2007 Document Revised: 04/15/2014 Document Reviewed: 10/20/2012 Elsevier Interactive Patient Education 2016 Elsevier Inc.  Upper Respiratory Infection, Adult Most upper respiratory infections (URIs) are a viral infection of the air passages leading to the lungs. A URI affects the nose, throat, and upper air passages. The most common type of URI is nasopharyngitis and is typically referred to as "the common cold." URIs run their course and usually go away on their own. Most of the time, a URI does not require medical attention, but sometimes a bacterial infection in the upper airways can follow a viral infection. This is called a secondary  infection. Sinus and middle ear infections are common types of secondary upper respiratory infections. Bacterial pneumonia can also complicate a URI. A URI can worsen asthma and chronic obstructive pulmonary disease (COPD). Sometimes, these complications can require emergency medical care and may be life threatening.  CAUSES Almost all URIs are caused by viruses. A virus is a type of germ and can spread from one person to another.  RISKS FACTORS You may be at risk for a URI if:   You smoke.   You have chronic heart or lung disease.  You have a weakened defense (immune) system.   You are very young or very old.   You have nasal allergies or asthma.  You work in crowded or poorly ventilated areas.  You work in health care facilities or schools. SIGNS AND SYMPTOMS  Symptoms typically develop 2-3 days after you come in contact with a cold virus. Most viral URIs last 7-10 days. However, viral URIs from the influenza virus (flu virus) can last 14-18 days and are typically more severe. Symptoms may include:   Runny or stuffy (congested) nose.   Sneezing.   Cough.   Sore throat.   Headache.   Fatigue.   Fever.   Loss of appetite.   Pain in your forehead, behind your eyes, and over your cheekbones (sinus pain).  Muscle aches.  DIAGNOSIS  Your health care provider may diagnose a URI by:  Physical exam.  Tests to check that your symptoms are not due to  another condition such as:  Strep throat.  Sinusitis.  Pneumonia.  Asthma. TREATMENT  A URI goes away on its own with time. It cannot be cured with medicines, but medicines may be prescribed or recommended to relieve symptoms. Medicines may help:  Reduce your fever.  Reduce your cough.  Relieve nasal congestion. HOME CARE INSTRUCTIONS   Take medicines only as directed by your health care provider.   Gargle warm saltwater or take cough drops to comfort your throat as directed by your health care  provider.  Use a warm mist humidifier or inhale steam from a shower to increase air moisture. This may make it easier to breathe.  Drink enough fluid to keep your urine clear or pale yellow.   Eat soups and other clear broths and maintain good nutrition.   Rest as needed.   Return to work when your temperature has returned to normal or as your health care provider advises. You may need to stay home longer to avoid infecting others. You can also use a face mask and careful hand washing to prevent spread of the virus.  Increase the usage of your inhaler if you have asthma.   Do not use any tobacco products, including cigarettes, chewing tobacco, or electronic cigarettes. If you need help quitting, ask your health care provider. PREVENTION  The best way to protect yourself from getting a cold is to practice good hygiene.   Avoid oral or hand contact with people with cold symptoms.   Wash your hands often if contact occurs.  There is no clear evidence that vitamin C, vitamin E, echinacea, or exercise reduces the chance of developing a cold. However, it is always recommended to get plenty of rest, exercise, and practice good nutrition.  SEEK MEDICAL CARE IF:   You are getting worse rather than better.   Your symptoms are not controlled by medicine.   You have chills.  You have worsening shortness of breath.  You have brown or red mucus.  You have yellow or brown nasal discharge.  You have pain in your face, especially when you bend forward.  You have a fever.  You have swollen neck glands.  You have pain while swallowing.  You have white areas in the back of your throat. SEEK IMMEDIATE MEDICAL CARE IF:   You have severe or persistent:  Headache.  Ear pain.  Sinus pain.  Chest pain.  You have chronic lung disease and any of the following:  Wheezing.  Prolonged cough.  Coughing up blood.  A change in your usual mucus.  You have a stiff neck.  You  have changes in your:  Vision.  Hearing.  Thinking.  Mood. MAKE SURE YOU:   Understand these instructions.  Will watch your condition.  Will get help right away if you are not doing well or get worse.   This information is not intended to replace advice given to you by your health care provider. Make sure you discuss any questions you have with your health care provider.   Document Released: 09/18/2000 Document Revised: 08/09/2014 Document Reviewed: 06/30/2013 Elsevier Interactive Patient Education 2016 Elsevier Inc. Sinusitis, Adult Sinusitis is redness, soreness, and puffiness (inflammation) of the air pockets in the bones of your face (sinuses). The redness, soreness, and puffiness can cause air and mucus to get trapped in your sinuses. This can allow germs to grow and cause an infection.  HOME CARE   Drink enough fluids to keep your pee (urine) clear or pale  yellow.  Use a humidifier in your home.  Run a hot shower to create steam in the bathroom. Sit in the bathroom with the door closed. Breathe in the steam 3-4 times a day.  Put a warm, moist washcloth on your face 3-4 times a day, or as told by your doctor.  Use salt water sprays (saline sprays) to wet the thick fluid in your nose. This can help the sinuses drain.  Only take medicine as told by your doctor. GET HELP RIGHT AWAY IF:   Your pain gets worse.  You have very bad headaches.  You are sick to your stomach (nauseous).  You throw up (vomit).  You are very sleepy (drowsy) all the time.  Your face is puffy (swollen).  Your vision changes.  You have a stiff neck.  You have trouble breathing. MAKE SURE YOU:   Understand these instructions.  Will watch your condition.  Will get help right away if you are not doing well or get worse.   This information is not intended to replace advice given to you by your health care provider. Make sure you discuss any questions you have with your health care  provider.   Document Released: 09/11/2007 Document Revised: 04/15/2014 Document Reviewed: 10/29/2011 Elsevier Interactive Patient Education [employer].

## 2015-08-20 NOTE — ED Notes (Signed)
The patient presented to the [facility] with a complaint of right sided ear pain and sinus pain and pressure x 4 days.

## 2016-04-30 DIAGNOSIS — Z79899 Other long term (current) drug therapy: Secondary | ICD-10-CM | POA: Diagnosis not present

## 2016-04-30 DIAGNOSIS — M791 Myalgia: Secondary | ICD-10-CM | POA: Diagnosis not present

## 2016-04-30 DIAGNOSIS — Z87891 Personal history of nicotine dependence: Secondary | ICD-10-CM | POA: Diagnosis not present

## 2016-05-13 DIAGNOSIS — H40033 Anatomical narrow angle, bilateral: Secondary | ICD-10-CM | POA: Diagnosis not present

## 2016-05-13 DIAGNOSIS — H04123 Dry eye syndrome of bilateral lacrimal glands: Secondary | ICD-10-CM | POA: Diagnosis not present

## 2016-05-16 DIAGNOSIS — M2550 Pain in unspecified joint: Secondary | ICD-10-CM | POA: Diagnosis not present

## 2016-05-16 DIAGNOSIS — Z91018 Allergy to other foods: Secondary | ICD-10-CM | POA: Diagnosis not present

## 2016-05-16 DIAGNOSIS — Z0001 Encounter for general adult medical examination with abnormal findings: Secondary | ICD-10-CM | POA: Diagnosis not present

## 2016-05-27 DIAGNOSIS — Z01419 Encounter for gynecological examination (general) (routine) without abnormal findings: Secondary | ICD-10-CM | POA: Diagnosis not present

## 2016-05-29 DIAGNOSIS — M79643 Pain in unspecified hand: Secondary | ICD-10-CM | POA: Diagnosis not present

## 2016-05-29 DIAGNOSIS — R21 Rash and other nonspecific skin eruption: Secondary | ICD-10-CM | POA: Diagnosis not present

## 2016-05-29 DIAGNOSIS — M545 Low back pain: Secondary | ICD-10-CM | POA: Diagnosis not present

## 2016-05-29 DIAGNOSIS — M79641 Pain in right hand: Secondary | ICD-10-CM | POA: Diagnosis not present

## 2016-05-29 DIAGNOSIS — M79671 Pain in right foot: Secondary | ICD-10-CM | POA: Diagnosis not present

## 2016-05-29 DIAGNOSIS — M2550 Pain in unspecified joint: Secondary | ICD-10-CM | POA: Diagnosis not present

## 2016-06-05 DIAGNOSIS — M549 Dorsalgia, unspecified: Secondary | ICD-10-CM | POA: Diagnosis not present

## 2016-06-05 DIAGNOSIS — M79643 Pain in unspecified hand: Secondary | ICD-10-CM | POA: Diagnosis not present

## 2016-06-05 DIAGNOSIS — M2550 Pain in unspecified joint: Secondary | ICD-10-CM | POA: Diagnosis not present

## 2016-06-05 DIAGNOSIS — R894 Abnormal immunological findings in specimens from other organs, systems and tissues: Secondary | ICD-10-CM | POA: Diagnosis not present

## 2016-06-05 DIAGNOSIS — R21 Rash and other nonspecific skin eruption: Secondary | ICD-10-CM | POA: Diagnosis not present

## 2016-06-12 DIAGNOSIS — L309 Dermatitis, unspecified: Secondary | ICD-10-CM | POA: Diagnosis not present

## 2016-06-12 DIAGNOSIS — L400 Psoriasis vulgaris: Secondary | ICD-10-CM | POA: Diagnosis not present

## 2016-06-13 DIAGNOSIS — Z1211 Encounter for screening for malignant neoplasm of colon: Secondary | ICD-10-CM | POA: Diagnosis not present

## 2016-06-13 DIAGNOSIS — R1310 Dysphagia, unspecified: Secondary | ICD-10-CM | POA: Diagnosis not present

## 2016-06-13 DIAGNOSIS — K582 Mixed irritable bowel syndrome: Secondary | ICD-10-CM | POA: Diagnosis not present

## 2016-06-19 DIAGNOSIS — Z1231 Encounter for screening mammogram for malignant neoplasm of breast: Secondary | ICD-10-CM | POA: Diagnosis not present

## 2016-06-24 ENCOUNTER — Other Ambulatory Visit: Payer: Self-pay | Admitting: Obstetrics and Gynecology

## 2016-06-24 DIAGNOSIS — R928 Other abnormal and inconclusive findings on diagnostic imaging of breast: Secondary | ICD-10-CM

## 2016-06-28 DIAGNOSIS — R21 Rash and other nonspecific skin eruption: Secondary | ICD-10-CM | POA: Diagnosis not present

## 2016-06-28 DIAGNOSIS — M79643 Pain in unspecified hand: Secondary | ICD-10-CM | POA: Diagnosis not present

## 2016-06-28 DIAGNOSIS — M549 Dorsalgia, unspecified: Secondary | ICD-10-CM | POA: Diagnosis not present

## 2016-06-29 DIAGNOSIS — H04123 Dry eye syndrome of bilateral lacrimal glands: Secondary | ICD-10-CM | POA: Diagnosis not present

## 2016-07-03 ENCOUNTER — Other Ambulatory Visit: Payer: Self-pay | Admitting: Obstetrics and Gynecology

## 2016-07-03 ENCOUNTER — Ambulatory Visit
Admission: RE | Admit: 2016-07-03 | Discharge: 2016-07-03 | Disposition: A | Discharge status: Home / Self Care | Payer: 59 | Source: Ambulatory Visit | Attending: Obstetrics and Gynecology | Admitting: Obstetrics and Gynecology

## 2016-07-03 DIAGNOSIS — R928 Other abnormal and inconclusive findings on diagnostic imaging of breast: Secondary | ICD-10-CM

## 2016-07-03 DIAGNOSIS — R921 Mammographic calcification found on diagnostic imaging of breast: Secondary | ICD-10-CM

## 2016-07-04 ENCOUNTER — Ambulatory Visit
Admission: RE | Admit: 2016-07-04 | Discharge: 2016-07-04 | Disposition: A | Discharge status: Home / Self Care | Payer: 59 | Source: Ambulatory Visit | Attending: Obstetrics and Gynecology | Admitting: Obstetrics and Gynecology

## 2016-07-04 ENCOUNTER — Other Ambulatory Visit: Payer: Self-pay | Admitting: Obstetrics and Gynecology

## 2016-07-04 DIAGNOSIS — R921 Mammographic calcification found on diagnostic imaging of breast: Secondary | ICD-10-CM | POA: Diagnosis not present

## 2016-07-04 DIAGNOSIS — N6012 Diffuse cystic mastopathy of left breast: Secondary | ICD-10-CM | POA: Diagnosis not present

## 2016-07-04 HISTORY — PX: BREAST BIOPSY: SHX20

## 2016-07-26 ENCOUNTER — Other Ambulatory Visit: Payer: Self-pay | Admitting: Gastroenterology

## 2016-07-26 DIAGNOSIS — R1310 Dysphagia, unspecified: Secondary | ICD-10-CM

## 2016-07-26 DIAGNOSIS — Z1211 Encounter for screening for malignant neoplasm of colon: Secondary | ICD-10-CM | POA: Diagnosis not present

## 2016-11-26 DIAGNOSIS — Z5181 Encounter for therapeutic drug level monitoring: Secondary | ICD-10-CM | POA: Diagnosis not present

## 2016-11-26 DIAGNOSIS — J0100 Acute maxillary sinusitis, unspecified: Secondary | ICD-10-CM | POA: Diagnosis not present

## 2017-05-26 DIAGNOSIS — Z0001 Encounter for general adult medical examination with abnormal findings: Secondary | ICD-10-CM | POA: Diagnosis not present

## 2017-05-26 DIAGNOSIS — Z8639 Personal history of other endocrine, nutritional and metabolic disease: Secondary | ICD-10-CM | POA: Diagnosis not present

## 2017-06-02 DIAGNOSIS — Z5181 Encounter for therapeutic drug level monitoring: Secondary | ICD-10-CM | POA: Diagnosis not present

## 2017-06-02 DIAGNOSIS — Z0000 Encounter for general adult medical examination without abnormal findings: Secondary | ICD-10-CM | POA: Diagnosis not present

## 2018-01-23 DIAGNOSIS — Z79899 Other long term (current) drug therapy: Secondary | ICD-10-CM | POA: Diagnosis not present

## 2018-02-19 DIAGNOSIS — J019 Acute sinusitis, unspecified: Secondary | ICD-10-CM | POA: Diagnosis not present

## 2018-05-12 DIAGNOSIS — Z79899 Other long term (current) drug therapy: Secondary | ICD-10-CM | POA: Diagnosis not present

## 2018-05-30 DIAGNOSIS — H04123 Dry eye syndrome of bilateral lacrimal glands: Secondary | ICD-10-CM | POA: Diagnosis not present

## 2018-05-30 DIAGNOSIS — H40033 Anatomical narrow angle, bilateral: Secondary | ICD-10-CM | POA: Diagnosis not present

## 2018-08-13 DIAGNOSIS — Z0001 Encounter for general adult medical examination with abnormal findings: Secondary | ICD-10-CM | POA: Diagnosis not present

## 2018-08-20 DIAGNOSIS — Z23 Encounter for immunization: Secondary | ICD-10-CM | POA: Diagnosis not present

## 2018-08-20 DIAGNOSIS — Z0000 Encounter for general adult medical examination without abnormal findings: Secondary | ICD-10-CM | POA: Diagnosis not present

## 2020-08-01 ENCOUNTER — Telehealth: Payer: Self-pay | Admitting: *Deleted

## 2020-08-01 NOTE — Telephone Encounter (Signed)
error 

## 2020-09-07 ENCOUNTER — Other Ambulatory Visit: Payer: Self-pay | Admitting: Internal Medicine

## 2020-09-07 DIAGNOSIS — N644 Mastodynia: Secondary | ICD-10-CM

## 2020-09-21 ENCOUNTER — Other Ambulatory Visit: Payer: Self-pay | Admitting: Internal Medicine

## 2020-09-21 DIAGNOSIS — N644 Mastodynia: Secondary | ICD-10-CM

## 2020-11-03 ENCOUNTER — Other Ambulatory Visit: Payer: 59

## 2021-04-18 ENCOUNTER — Other Ambulatory Visit: Payer: Self-pay

## 2021-04-18 ENCOUNTER — Emergency Department (HOSPITAL_BASED_OUTPATIENT_CLINIC_OR_DEPARTMENT_OTHER)
Admission: EM | Admit: 2021-04-18 | Discharge: 2021-04-18 | Disposition: A | Discharge status: Home / Self Care | Payer: 59 | Attending: Emergency Medicine | Admitting: Emergency Medicine

## 2021-04-18 ENCOUNTER — Emergency Department (HOSPITAL_BASED_OUTPATIENT_CLINIC_OR_DEPARTMENT_OTHER): Payer: 59 | Admitting: Radiology

## 2021-04-18 ENCOUNTER — Encounter (HOSPITAL_BASED_OUTPATIENT_CLINIC_OR_DEPARTMENT_OTHER): Payer: Self-pay | Admitting: Emergency Medicine

## 2021-04-18 DIAGNOSIS — M549 Dorsalgia, unspecified: Secondary | ICD-10-CM | POA: Insufficient documentation

## 2021-04-18 DIAGNOSIS — G8929 Other chronic pain: Secondary | ICD-10-CM | POA: Diagnosis not present

## 2021-04-18 DIAGNOSIS — R52 Pain, unspecified: Secondary | ICD-10-CM

## 2021-04-18 DIAGNOSIS — Z20822 Contact with and (suspected) exposure to covid-19: Secondary | ICD-10-CM | POA: Insufficient documentation

## 2021-04-18 HISTORY — DX: Anxiety disorder, unspecified: F41.9

## 2021-04-18 HISTORY — DX: Attention-deficit hyperactivity disorder, unspecified type: F90.9

## 2021-04-18 HISTORY — DX: Unspecified osteoarthritis, unspecified site: M19.90

## 2021-04-18 LAB — RESP PANEL BY RT-PCR (FLU A&B, COVID) ARPGX2
Influenza A by PCR: NEGATIVE
Influenza B by PCR: NEGATIVE
SARS Coronavirus 2 by RT PCR: NEGATIVE

## 2021-04-18 LAB — COMPREHENSIVE METABOLIC PANEL
ALT: 11 U/L (ref 0–44)
AST: 14 U/L — ABNORMAL LOW (ref 15–41)
Albumin: 3.9 g/dL (ref 3.5–5.0)
Alkaline Phosphatase: 50 U/L (ref 38–126)
Anion gap: 10 (ref 5–15)
BUN: 13 mg/dL (ref 6–20)
CO2: 24 mmol/L (ref 22–32)
Calcium: 9.5 mg/dL (ref 8.9–10.3)
Chloride: 101 mmol/L (ref 98–111)
Creatinine, Ser: 0.65 mg/dL (ref 0.44–1.00)
GFR, Estimated: >60 mL/min (ref >60)
Glucose, Bld: 96 mg/dL (ref 70–99)
Potassium: 3.7 mmol/L (ref 3.5–5.1)
Sodium: 135 mmol/L (ref 135–145)
Total Bilirubin: 0.6 mg/dL (ref 0.3–1.2)
Total Protein: 6.9 g/dL (ref 6.5–8.1)

## 2021-04-18 LAB — URINALYSIS, ROUTINE W REFLEX MICROSCOPIC
Bilirubin Urine: NEGATIVE
Glucose, UA: NEGATIVE mg/dL
Hgb urine dipstick: NEGATIVE
Ketones, ur: 15 mg/dL — AB
Leukocytes,Ua: NEGATIVE
Nitrite: NEGATIVE
Protein, ur: NEGATIVE mg/dL
Specific Gravity, Urine: 1.013 (ref 1.005–1.030)
pH: 6.5 (ref 5.0–8.0)

## 2021-04-18 LAB — CBC WITH DIFFERENTIAL/PLATELET
Abs Immature Granulocytes: 0.02 10*3/uL (ref 0.00–0.07)
Basophils Absolute: 0 10*3/uL (ref 0.0–0.1)
Basophils Relative: 1 %
Eosinophils Absolute: 0 10*3/uL (ref 0.0–0.5)
Eosinophils Relative: 0 %
HCT: 35.7 % — ABNORMAL LOW (ref 36.0–46.0)
Hemoglobin: 12.2 g/dL (ref 12.0–15.0)
Immature Granulocytes: 0 %
Lymphocytes Relative: 21 %
Lymphs Abs: 1.9 10*3/uL (ref 0.7–4.0)
MCH: 31.4 pg (ref 26.0–34.0)
MCHC: 34.2 g/dL (ref 30.0–36.0)
MCV: 92 fL (ref 80.0–100.0)
Monocytes Absolute: 0.6 10*3/uL (ref 0.1–1.0)
Monocytes Relative: 7 %
Neutro Abs: 6.2 10*3/uL (ref 1.7–7.7)
Neutrophils Relative %: 71 %
Platelets: 215 10*3/uL (ref 150–400)
RBC: 3.88 MIL/uL (ref 3.87–5.11)
RDW: 13.2 % (ref 11.5–15.5)
WBC: 8.8 10*3/uL (ref 4.0–10.5)
nRBC: 0 % (ref 0.0–0.2)

## 2021-04-18 LAB — CK: Total CK: 80 U/L (ref 38–234)

## 2021-04-18 MED ORDER — ACETAMINOPHEN 325 MG PO TABS
650.0000 mg | ORAL_TABLET | Freq: Once | ORAL | Status: AC
Start: 2021-04-18 — End: 2021-04-18
  Administered 2021-04-18: 650 mg via ORAL
  Filled 2021-04-18: qty 2

## 2021-04-18 MED ORDER — KETOROLAC TROMETHAMINE 15 MG/ML IJ SOLN
15.0000 mg | Freq: Once | INTRAMUSCULAR | Status: AC
Start: 2021-04-18 — End: 2021-04-18
  Administered 2021-04-18: 15 mg via INTRAVENOUS
  Filled 2021-04-18: qty 1

## 2021-04-18 MED ORDER — NAPROXEN 500 MG PO TABS: 500.0000 mg | ORAL_TABLET | Freq: Two times a day (BID) | ORAL | 0 refills | Status: AC

## 2021-04-18 MED ORDER — SODIUM CHLORIDE 0.9 % IV BOLUS
1000.0000 mL | Freq: Once | INTRAVENOUS | Status: AC
  Administered 2021-04-18: 1000 mL via INTRAVENOUS

## 2021-04-18 NOTE — ED Provider Notes (Signed)
[facility] EMERGENCY DEPT Provider Note   CSN: [id number] Arrival date & time: 04/18/21  1610     History  Chief Complaint  Patient presents with   Back Pain    Laura Alexander is a 56 y.o. female.  HPI   Pt is a 56 y/o female with a h/o chronic back pain who present to the ED today for eval of body aches. She states pain started around 03/27/2021. Pain is diffuse and is rated at 7/10. At its worst the pain was rated a 10/10. She states pain is so severe she has had difficulty walking. She reports sweats, chills, but has not taken her temperature at home.   Denies cough, congestion, sore throat, vomiting, diarrhea, constipation, dysuria, frequency.  She reports some intermittent rhinorrhea for some time.   Denies h/o ivdu or etoh use.   Home Medications Prior to Admission medications   Medication Sig Start Date End Date Taking? Authorizing Provider  cyclobenzaprine (FLEXERIL) 10 MG tablet Take 10 mg by mouth 3 (three) times daily as needed for muscle spasms.   Yes [provider]  amoxicillin-clavulanate (AUGMENTIN) 875-125 MG tablet Take 1 tablet by mouth every 12 (twelve) hours. 08/20/15   Konrad Felix, PA  diazepam (VALIUM) 5 MG tablet Take 5 mg by mouth every 6 (six) hours as needed.    [provider]  fexofenadine-pseudoephedrine (ALLEGRA-D 24) 180-240 MG 24 hr tablet Take 1 tablet by mouth daily.    [provider]      Allergies    Eggs or egg-derived products and Codeine    Review of Systems   Review of Systems  Constitutional:  Positive for chills and diaphoresis.  HENT:  Positive for rhinorrhea. Negative for congestion, ear pain and sore throat.   Eyes:  Negative for visual disturbance.  Respiratory:  Negative for cough and shortness of breath.   Cardiovascular:  Negative for chest pain.  Gastrointestinal:  Negative for abdominal pain, constipation, diarrhea, nausea and vomiting.  Genitourinary:  Negative for  dysuria and hematuria.  Musculoskeletal:  Positive for myalgias.  Skin:  Negative for rash.  Neurological:  Negative for headaches.  All other systems reviewed and are negative.  Physical Exam Updated Vital Signs BP 109/72    Pulse (!) 110    Temp 99.7 F (37.6 C)    Resp 18    SpO2 98%  Physical Exam Vitals and nursing note reviewed.  Constitutional:      General: She is not in acute distress.    Appearance: She is well-developed.  HENT:     Head: Normocephalic and atraumatic.  Eyes:     Conjunctiva/sclera: Conjunctivae normal.  Cardiovascular:     Rate and Rhythm: Normal rate and regular rhythm.     Heart sounds: Normal heart sounds. No murmur heard. Pulmonary:     Effort: Pulmonary effort is normal. No respiratory distress.     Breath sounds: Normal breath sounds. No wheezing, rhonchi or rales.  Abdominal:     General: Bowel sounds are normal.     Palpations: Abdomen is soft.     Tenderness: There is no abdominal tenderness. There is no guarding or rebound.  Musculoskeletal:        General: No swelling.     Cervical back: Neck supple.  Skin:    General: Skin is warm and dry.     Capillary Refill: Capillary refill takes less than 2 seconds.  Neurological:     Mental Status: She is  alert.  Psychiatric:        Mood and Affect: Mood normal.    ED Results / Procedures / Treatments   Labs (all labs ordered are listed, but only abnormal results are displayed) Labs Reviewed  RESP PANEL BY RT-PCR (FLU A&B, COVID) [id number]    EKG None  Radiology No results found.  Procedures Procedures    Medications Ordered in ED Medications - No data to display  ED Course/ Medical Decision Making/ A&P                           Medical Decision Making  56 year old female presents the emergency department today for evaluation of body aches.  She denies any other specific symptoms at this time including no cough.  She has had some intermittent rhinorrhea but this does not sound  very severe.  She is not reporting any sinus pressure or pain.  She denies any fevers at home but she did have a borderline temp here.  Her lungs are clear to auscultation bilaterally, heart was initially tachycardic though that improved.  Abdomen soft and nontender.  I reviewed/interpreted her work-up which showed no leukocytosis or anemia, electrolytes reassuring, kidney function liver function are normal.  CK negative.  UA negative for UTI.  Chest x-ray negative for pneumonia.  COVID and flu tests are negative.  At this time I have not identified any etiology of her body aches however she has no evidence today of any emergent process that would require further work-up or intervention at this time.  Have advised that she follow-up closely with her PCP and return to the ED for any new or worsening symptoms in the meantime.  She is advised to continue her home medications and I will also give her a prescription for some anti-inflammatories for home.  Patient is in agreement with the plan and has no further questions.   Final Clinical Impression(s) / ED Diagnoses Final diagnoses:  None    Rx / DC Orders ED Discharge Orders     None         Bishop Dublin 04/18/21 2100    Lacretia Leigh, MD 04/18/21 2300

## 2021-04-18 NOTE — Discharge Instructions (Addendum)
You may alternate taking Tylenol and Naproxen as needed for pain control. You may take Naproxen twice daily as directed on your discharge paperwork and you may take  [phone] mg of Tylenol every 6 hours. Do not exceed 4000 mg of Tylenol daily as this can lead to liver damage. Also, make sure to take Naproxen with meals as it can cause an upset stomach. Do not take other NSAIDs while taking Naproxen such as (Aleve, Ibuprofen, Aspirin, Celebrex, etc) and do not take more than the prescribed dose as this can lead to ulcers and bleeding in your GI tract. You may use warm and cold compresses to help with your symptoms.   Please follow up with your primary doctor within the next 7-10 days for re-evaluation and further treatment of your symptoms.   Please return to the ER sooner if you have any new or worsening symptoms.

## 2021-04-18 NOTE — ED Notes (Signed)
Patient ambulatory to bathroom.

## 2021-04-18 NOTE — ED Notes (Signed)
EMT-P provided AVS using Teachback Method. Patient verbalizes understanding of Discharge Instructions. Opportunity for Questioning and Answers were provided by EMT-P. Patient Discharged from ED.  ? ?

## 2021-04-18 NOTE — ED Triage Notes (Addendum)
Body aches all   since last night denies cold s/s , states has had chills  states just rx for robaxin and tramdol  2 weeks ago and those are not helping

## 2021-04-30 ENCOUNTER — Other Ambulatory Visit: Payer: Self-pay | Admitting: Orthopaedic Surgery

## 2021-04-30 DIAGNOSIS — M545 Low back pain, unspecified: Secondary | ICD-10-CM

## 2021-05-12 ENCOUNTER — Other Ambulatory Visit: Payer: Self-pay

## 2021-05-12 ENCOUNTER — Ambulatory Visit
Admission: RE | Admit: 2021-05-12 | Discharge: 2021-05-12 | Disposition: A | Discharge status: Home / Self Care | Payer: 59 | Source: Ambulatory Visit | Attending: Orthopaedic Surgery | Admitting: Orthopaedic Surgery

## 2021-05-12 DIAGNOSIS — M545 Low back pain, unspecified: Secondary | ICD-10-CM

## 2021-05-24 ENCOUNTER — Other Ambulatory Visit: Payer: Self-pay | Admitting: Orthopaedic Surgery

## 2021-05-24 DIAGNOSIS — M4722 Other spondylosis with radiculopathy, cervical region: Secondary | ICD-10-CM

## 2021-05-24 DIAGNOSIS — M4322 Fusion of spine, cervical region: Secondary | ICD-10-CM

## 2021-05-29 ENCOUNTER — Ambulatory Visit
Admission: RE | Admit: 2021-05-29 | Discharge: 2021-05-29 | Disposition: A | Discharge status: Home / Self Care | Payer: 59 | Source: Ambulatory Visit | Attending: Orthopaedic Surgery | Admitting: Orthopaedic Surgery

## 2021-05-29 DIAGNOSIS — M4322 Fusion of spine, cervical region: Secondary | ICD-10-CM

## 2021-05-29 DIAGNOSIS — M4722 Other spondylosis with radiculopathy, cervical region: Secondary | ICD-10-CM

## 2021-05-29 MED ORDER — GADOBENATE DIMEGLUMINE 529 MG/ML IV SOLN
12.0000 mL | Freq: Once | INTRAVENOUS | Status: AC | PRN
  Administered 2021-05-29: 12 mL via INTRAVENOUS

## 2021-07-07 HISTORY — PX: LUMBAR FUSION: SHX111

## 2022-01-04 ENCOUNTER — Other Ambulatory Visit: Payer: Self-pay

## 2022-01-04 DIAGNOSIS — R1906 Epigastric swelling, mass or lump: Secondary | ICD-10-CM

## 2022-01-04 DIAGNOSIS — N644 Mastodynia: Secondary | ICD-10-CM

## 2022-01-11 ENCOUNTER — Ambulatory Visit
Admission: RE | Admit: 2022-01-11 | Discharge: 2022-01-11 | Disposition: A | Discharge status: Home / Self Care | Payer: 59 | Source: Ambulatory Visit

## 2022-01-11 DIAGNOSIS — R1906 Epigastric swelling, mass or lump: Secondary | ICD-10-CM

## 2022-01-16 ENCOUNTER — Ambulatory Visit
Admission: RE | Admit: 2022-01-16 | Discharge: 2022-01-16 | Disposition: A | Discharge status: Home / Self Care | Payer: 59 | Source: Ambulatory Visit

## 2022-01-16 ENCOUNTER — Ambulatory Visit: Admission: RE | Admit: 2022-01-16 | Payer: 59 | Source: Ambulatory Visit

## 2022-01-16 DIAGNOSIS — N644 Mastodynia: Secondary | ICD-10-CM

## 2022-01-31 NOTE — Progress Notes (Signed)
Referring Physician:  Deon Pilling, NP [address],  [place] [address]  Primary Physician:  Glendon Axe, MD  History of Present Illness: 02/01/2022 [name] had L4-S1 laminectomy and PSF/TLIF by Dr. Rennis Harding on 07/23/21. She improved after her surgery- she was having severe back and leg pain prior to surgery.   PCP advised that she get a second opinion.   She has increased LBP with prolonged walking (has difficulty shopping at [facility]). She has intermittent tingling in her legs posterior to her feet. No leg pain. No weakness in her legs.   She also has upper lumbar pain that radiates to her mid back up to her shoulders that is constant and worse by the end of the day. She feels like she is leaning forward by the end of the day. Some radiation of pain into her ribs. No rashes noted.   She has known severe right carpal tunnel. It is not as bad on left. She has history of bilateral carpal tunnel release in 2013.   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 She has to return to full duty by January or she loses her job.   PT was ordered at her visit with Dr. Towanda Malkin office last week. She has not started. Is looking for a place she can also get massage therapy.   She states she had scan of her mid back and was told it was normal.   She smokes 1 ppd. Was down to 4 cigarettes per day prior to surgery.   She has noted some urinary urgency. No other bowel or bladder issues. No perineal numbness.   Conservative measures:  Physical therapy: 2 visits of HHPT since surgery.   Multimodal medical therapy including regular antiinflammatories: flexeril  Injections: No epidural steroid injections since surgery  Past Surgery:  L4-S1 laminectomy and PSF/TLIF by Dr. Rennis Harding on 07/23/21 ACD[facility] x 2 (2013 [facility])  Laura Alexander has no symptoms of cervical myelopathy.  The symptoms are causing a significant impact on the patient's life.   Review of Systems:  A 10 point review of systems is negative, except for the pertinent positives and negatives detailed in the HPI.  Past Medical History: Past Medical History:  Diagnosis Date   ADHD    Anxiety    Carpal tunnel syndrome    Chronic pain    Inflammatory arthritis     Past Surgical History: Past Surgical History:  Procedure Laterality Date   ABDOMINAL HYSTERECTOMY  12/08/2007   Partial   BREAST BIOPSY  12/12/2011   Procedure: BREAST BIOPSY WITH NEEDLE LOCALIZATION;  Surgeon: Rolm Bookbinder, MD;  Location: [facility];  Service: General;  Laterality: Right;   BREAST BIOPSY Left 07/04/2016   BREAST EXCISIONAL BIOPSY Right 2013   CARPAL TUNNEL RELEASE  12/20/2011   Procedure: CARPAL TUNNEL RELEASE;  Surgeon: Cammie Sickle., MD;  Location: [facility];  Service: Orthopedics;  Laterality: Left;  left carpal tunnel release   CARPAL TUNNEL RELEASE  03/25/2012   Procedure: CARPAL TUNNEL RELEASE;  Surgeon: Wynonia Sours, MD;  Location: [facility];  Service: Orthopedics;  Laterality: Right;  GENERAL, FAB   CERVICAL FUSION  01/23/2012   [facility]   CERVICAL FUSION  2014   Dr Patrice Paradise  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   LUMBAR FUSION  07/2021   Dr Patrice Paradise   MASS EXCISION  12/12/2011   Procedure: EXCISION MASS;  Surgeon: Rolm Bookbinder, MD;  Location: [facility];  Service: General;  Laterality: Right;  Right breast masses excisions times two, right breast wire localization biopsy    Allergies: Allergies as of 02/01/2022 - Review Complete 02/01/2022  Allergen Reaction Noted   Eggs or egg-derived products Anaphylaxis 09/13/2011   Codeine Nausea And Vomiting 09/13/2011    Medications: Outpatient Encounter Medications as of 02/01/2022   Medication Sig   amoxicillin-clavulanate (AUGMENTIN) 875-125 MG tablet Take 1 tablet by mouth every 12 (twelve) hours.   cyclobenzaprine (FLEXERIL) 10 MG tablet Take 10 mg by mouth 3 (three) times daily as needed for muscle spasms.   diazepam (VALIUM) 5 MG tablet Take 5 mg by mouth every 6 (six) hours as needed.   fexofenadine-pseudoephedrine (ALLEGRA-D 24) 180-240 MG 24 hr tablet Take 1 tablet by mouth daily.   No facility-administered encounter medications on file as of 02/01/2022.    Social History: Social History   Tobacco Use   Smoking status: Every Day    Packs/day: 0.50    Types: Cigarettes   Smokeless tobacco: Never  Vaping Use   Vaping Use: Never used  Substance Use Topics   Alcohol use: Yes    Alcohol/week: 0.0 standard drinks of alcohol    Comment: Social   Drug use: No    Family Medical History: No family history on file.  Physical Examination: Vitals:   02/01/22 1332  BP: (!) 149/97  Pulse: (!) 114    General: Patient is well developed, well nourished, calm, collected, and in no apparent distress. Attention to examination is appropriate.  Respiratory: Patient is breathing without any difficulty.   NEUROLOGICAL:     Awake, alert, oriented to person, place, and time.  Speech is clear and fluent. Fund of knowledge is appropriate.   Cranial Nerves: Pupils equal round and reactive to light.  Facial tone is symmetric.  Facial sensation is symmetric.  Lumbar incision is well healed. No significant lumbar tenderness noted.   She has diffuse bilateral trapezial tenderness into her bilateral scapular region.   No abnormal lesions on exposed skin.   Strength: Side Biceps Triceps Deltoid Interossei Grip Wrist Ext. Wrist Flex.  R '5 5 5 5 5 5 5  '$ L '5 5 5 5 5 5 5   '$ Side Iliopsoas Quads Hamstring PF DF EHL  R '5 5 5 5 5 5  '$ L '5 5 5 5 5 5   '$ Reflexes are 2+ and symmetric at the biceps, triceps, brachioradialis, patella and achilles.   Hoffman's is absent.   Clonus is not present.   Bilateral upper and lower extremity sensation is intact to light touch.     Gait is normal.     Medical Decision Making  Imaging: No postop lumbar imaging available.     Assessment and Plan: Laura Alexander is a pleasant 56 y.o. female  had L4-S1 laminectomy and PSF/TLIF by Dr. Rennis Harding on 07/23/21. She improved after her surgery- she was having severe back and leg pain prior to surgery.   She has increased LBP with prolonged walking (has difficulty shopping at [facility]). She has intermittent tingling in her legs posterior to her feet. No leg pain.  She also has upper lumbar pain that radiates to her mid back up to her shoulders that is  constant and worse by the end of the day. She feels like she is leaning forward by the end of the day. Some radiation of pain into her ribs.  No postop imaging available for my review. Pain in mid/upper back may be more myofascial in nature as she has diffuse tenderness on exam.   Treatment options discussed with patient and following plan made:   - Xrays of thoracic and lumbar spine ordered.  - Will review xrays and call her with further plan.  - Will likely agree with PT for thoracic/lumbar spine, but will let her know.  - Of note, she is very concerned about her return to work. She does not feel she can go back now. If she does not return to full duty by January then she will lose her job.   Of note, BP initially was 149/97 and pulse was 114. She was crying and upset prior to this.   Recheck BP at end of visit was 152/98 with pulse of 110.   Review of PCP note on 01/02/22 shows BP of 148/88 and pulse of 100.   She has no CP, SOB, blurry vision, or headaches. Advised to check blood pressure at home and call PCP with readings. Go to ED if she has any of the above symptoms.   I spent a total of 45 minutes in face-to-face and non-face-to-face activities related to this patient's care toda including review of outside records,  review of imaging, review of symptoms, physical exam, discussion of differential diagnosis, discussion of treatment options, and documentation.   Thank you for involving me in the care of this patient.   [name] PA-C Dept. of Neurosurgery

## 2022-02-01 ENCOUNTER — Ambulatory Visit
Admission: RE | Admit: 2022-02-01 | Discharge: 2022-02-01 | Disposition: A | Discharge status: Home / Self Care | Payer: 59 | Attending: Orthopedic Surgery | Admitting: Orthopedic Surgery

## 2022-02-01 ENCOUNTER — Encounter: Payer: Self-pay | Admitting: Orthopedic Surgery

## 2022-02-01 ENCOUNTER — Ambulatory Visit: Payer: 59 | Admitting: Orthopedic Surgery

## 2022-02-01 ENCOUNTER — Ambulatory Visit
Admission: RE | Admit: 2022-02-01 | Discharge: 2022-02-01 | Disposition: A | Discharge status: Home / Self Care | Payer: 59 | Source: Ambulatory Visit | Attending: Orthopedic Surgery | Admitting: Orthopedic Surgery

## 2022-02-01 VITALS — BP 152/98 | HR 110 | Ht 67.0 in | Wt 130.8 lb

## 2022-02-01 DIAGNOSIS — M7918 Myalgia, other site: Secondary | ICD-10-CM

## 2022-02-01 DIAGNOSIS — M546 Pain in thoracic spine: Secondary | ICD-10-CM

## 2022-02-01 DIAGNOSIS — M545 Low back pain, unspecified: Secondary | ICD-10-CM | POA: Insufficient documentation

## 2022-02-01 DIAGNOSIS — M4316 Spondylolisthesis, lumbar region: Secondary | ICD-10-CM | POA: Insufficient documentation

## 2022-02-01 DIAGNOSIS — M47814 Spondylosis without myelopathy or radiculopathy, thoracic region: Secondary | ICD-10-CM | POA: Diagnosis not present

## 2022-02-01 DIAGNOSIS — Z981 Arthrodesis status: Secondary | ICD-10-CM

## 2022-02-01 DIAGNOSIS — M47816 Spondylosis without myelopathy or radiculopathy, lumbar region: Secondary | ICD-10-CM | POA: Insufficient documentation

## 2022-02-01 DIAGNOSIS — G8929 Other chronic pain: Secondary | ICD-10-CM

## 2022-02-01 NOTE — Patient Instructions (Addendum)
It was so nice to see you today, I am sorry that you are hurting so much.   I want to get some updated xrays of your mid and lower back. You can go across the street to [facility]. The orders are in and you do not need an appointment.   I will review your xrays with Dr. Izora Ribas and call you next week with further plan. You should hear from me by Tuesday or Wednesday.   Check your blood pressure at home and keep track of it. Call PCP with readings. If you start having headaches, shortness of breath, blurry vision, chest pain, or feel your heart racing then go to ED.   Please do not hesitate to call if you have any questions or concerns. You can also message me in Time.   [name] PA-C [phone]

## 2022-02-05 ENCOUNTER — Other Ambulatory Visit (HOSPITAL_COMMUNITY): Payer: Self-pay | Admitting: Gastroenterology

## 2022-02-05 DIAGNOSIS — R1011 Right upper quadrant pain: Secondary | ICD-10-CM

## 2022-02-06 ENCOUNTER — Encounter: Payer: Self-pay | Admitting: Orthopedic Surgery

## 2022-02-06 NOTE — Progress Notes (Signed)
Lumbar xrays dated 02/01/22:  FINDINGS: No fracture.  No bone lesion.   Grade 1 retrolisthesis of L1 and L2.  No other malalignment.   Previous posterior lumbar spine fusion, L4 through S1, with bilateral pedicle screws and intact interconnecting rods. Well-centered intervertebral cages are noted at these levels. No evidence loosening of the orthopedic hardware.   Moderate loss of disc height at L1-L2 with endplate spurring.   No subluxation with flexion or extension.   Soft tissues are unremarkable.   IMPRESSION: 1. No fracture or acute finding. 2. Previous posterior L4 through S1 lumbar spine fusion, orthopedic hardware well positioned and well-seated. No movement with flexion or extension. 3. Grade 1 retrolisthesis with moderate disc degenerative changes at L1-L2. No subluxation with flexion or extension.     Electronically Signed   By: Lajean Manes M.D.   On: 02/05/2022 13:39   Thoracic xrays dated 02/01/22:  FINDINGS: No fracture, bone lesion or spondylolisthesis.   Moderate loss of disc height with endplate spurring throughout most of the thoracic spine.   Soft tissues are unremarkable.   IMPRESSION: 1. No fracture or acute finding.  No malalignment. 2. Moderate disc degenerative changes.     Electronically Signed   By: Lajean Manes M.D.   On: 02/05/2022 13:37  I reviewed about studies and agree with radiology report.   Above xrays also reviewed with Dr. Izora Ribas. No postop complications noted in lumbar xrays. Agree with PT as ordered from Dr. Towanda Malkin office. She can follow up with [place] prn.   Will set up phone visit to review and discuss with her.

## 2022-02-07 ENCOUNTER — Encounter: Payer: Self-pay | Admitting: Orthopedic Surgery

## 2022-02-07 ENCOUNTER — Ambulatory Visit (INDEPENDENT_AMBULATORY_CARE_PROVIDER_SITE_OTHER): Payer: 59 | Admitting: Orthopedic Surgery

## 2022-02-07 DIAGNOSIS — M7918 Myalgia, other site: Secondary | ICD-10-CM

## 2022-02-07 DIAGNOSIS — M546 Pain in thoracic spine: Secondary | ICD-10-CM

## 2022-02-07 DIAGNOSIS — Z981 Arthrodesis status: Secondary | ICD-10-CM

## 2022-02-07 DIAGNOSIS — G8929 Other chronic pain: Secondary | ICD-10-CM | POA: Diagnosis not present

## 2022-02-07 DIAGNOSIS — M545 Low back pain, unspecified: Secondary | ICD-10-CM

## 2022-02-07 NOTE — Progress Notes (Signed)
Telephone Visit- Progress Note: Referring Physician:  Glendon Axe, MD [address] [place],  [place] [address]  Primary Physician:  Glendon Axe, MD  This visit was performed via telephone.  Patient location: home Provider location: office  I spent a total of 15 minutes non-face-to-face activities for this visit on the date of this encounter including review of current clinical condition and response to treatment.   Chief Complaint:  review of lumbar/thoracic xrays  History of Present Illness: Laura Alexander is a 56 y.o. female has been set up for a telephone visit to review her xray results.   She had L4-S1 laminectomy and PSF/TLIF by Dr. Rennis Harding on 07/23/21. She improved after her surgery- she was having severe back and leg pain prior to surgery which improved.   She was sent to PT by Dr. Patrice Paradise and has not started yet.   She continues with increased LBP with prolonged walking. She has intermittent tingling in her legs posterior to her feet. No leg pain. No weakness in her legs.    She also has upper lumbar pain that radiates to her mid back up to her shoulders that is constant and worse by the end of the day.   Conservative measures:  Physical therapy: 2 visits of HHPT since surgery.   Multimodal medical therapy including regular antiinflammatories: flexeril  Injections: No epidural steroid injections since surgery   Past Surgery:  L4-S1 laminectomy and PSF/TLIF by Dr. Rennis Harding on 07/23/21 ACDF x 2 (2013 Laura Alexander)  Exam: No exam done as this was a telephone encounter.     Imaging: Lumbar xrays dated 02/01/22:  FINDINGS: No fracture.  No bone lesion.   Grade 1 retrolisthesis of L1 and L2.  No other malalignment.   Previous posterior lumbar spine fusion, L4 through S1, with bilateral pedicle screws and intact interconnecting rods. Well-centered intervertebral cages are noted at these levels. No evidence loosening of the orthopedic hardware.    Moderate loss of disc height at L1-L2 with endplate spurring.   No subluxation with flexion or extension.   Soft tissues are unremarkable.   IMPRESSION: 1. No fracture or acute finding. 2. Previous posterior L4 through S1 lumbar spine fusion, orthopedic hardware well positioned and well-seated. No movement with flexion or extension. 3. Grade 1 retrolisthesis with moderate disc degenerative changes at L1-L2. No subluxation with flexion or extension.     Electronically Signed   By: Lajean Manes M.D.   On: 02/05/2022 13:39     Thoracic xrays dated 02/01/22:  FINDINGS: No fracture, bone lesion or spondylolisthesis.   Moderate loss of disc height with endplate spurring throughout most of the thoracic spine.   Soft tissues are unremarkable.   IMPRESSION: 1. No fracture or acute finding.  No malalignment. 2. Moderate disc degenerative changes.     Electronically Signed   By: Lajean Manes M.D.   On: 02/05/2022 13:37   I have personally reviewed the images and agree with the above interpretation.  Assessment and Plan: [name] is a pleasant 56 y.o. female with increased LBP with prolonged walking. She has intermittent tingling in her legs posterior to her feet. No leg pain.   She is s/p L4-S1 laminectomy and PSF/TLIF by Dr. Rennis Harding on 07/23/21.    Xrays show hardware in good position with no signs of loosening. Thoracic xrays show degenerative changes.   Also with upper lumbar pain that radiates to her mid back up to her shoulders that is  constant and worse by the end of the day.    Thoracic xrays show some degenerative changes. Exam at last visit showed diffuse tenderness, this pain is likely more myofascial in nature.   Treatment options discussed with patient and following plan made:   - Agree with PT as ordered by Dr. Towanda Malkin office.  - She will continue to follow up with Dr. Patrice Paradise.  - Follow up here prn.   [name] PA-C Neurosurgery

## 2022-02-12 ENCOUNTER — Ambulatory Visit (HOSPITAL_COMMUNITY)
Admission: RE | Admit: 2022-02-12 | Discharge: 2022-02-12 | Disposition: A | Discharge status: Home / Self Care | Payer: 59 | Source: Ambulatory Visit | Attending: Gastroenterology | Admitting: Gastroenterology

## 2022-02-12 DIAGNOSIS — R1011 Right upper quadrant pain: Secondary | ICD-10-CM | POA: Insufficient documentation

## 2022-06-06 ENCOUNTER — Encounter: Payer: Self-pay | Admitting: Physical Medicine & Rehabilitation

## 2022-06-06 ENCOUNTER — Other Ambulatory Visit: Payer: Self-pay | Admitting: Physical Medicine & Rehabilitation

## 2022-06-06 ENCOUNTER — Ambulatory Visit
Admission: RE | Admit: 2022-06-06 | Discharge: 2022-06-06 | Disposition: A | Discharge status: Home / Self Care | Payer: 59 | Source: Ambulatory Visit | Attending: Physical Medicine & Rehabilitation | Admitting: Physical Medicine & Rehabilitation

## 2022-06-06 DIAGNOSIS — M542 Cervicalgia: Secondary | ICD-10-CM

## 2022-08-05 ENCOUNTER — Other Ambulatory Visit: Payer: Self-pay

## 2022-08-05 ENCOUNTER — Ambulatory Visit
Admission: EM | Admit: 2022-08-05 | Discharge: 2022-08-05 | Disposition: A | Discharge status: Home / Self Care | Payer: 59

## 2022-08-05 DIAGNOSIS — N611 Abscess of the breast and nipple: Secondary | ICD-10-CM | POA: Diagnosis not present

## 2022-08-05 DIAGNOSIS — N644 Mastodynia: Secondary | ICD-10-CM

## 2022-08-05 MED ORDER — CEPHALEXIN 500 MG PO CAPS: 500.0000 mg | ORAL_CAPSULE | Freq: Four times a day (QID) | ORAL | 0 refills | Status: AC

## 2022-08-05 NOTE — Discharge Instructions (Signed)
I have prescribed an antibiotic for possible infection to your left breast.  I have placed a referral to the breast imaging center.  If they do not call you within 48 to 72 hours, please call them to schedule an appointment.  Keep scheduled appointment with PCP as well.

## 2022-08-05 NOTE — ED Provider Notes (Signed)
[facility]    CSN: [id number] Arrival date & time: 08/05/22  1713      History   Chief Complaint No chief complaint on file.   HPI Laura Alexander is a 57 y.o. female.   Patient presents with pain and swelling to left nipple that has been present for multiple months since February.  Reports that over the past 3 days she developed increase in swelling and has had purulent drainage.  Denies any associated fever, body aches, chills.  Patient reports that she has not been seen previously by healthcare provider for this.     Past Medical History:  Diagnosis Date   ADHD    Anxiety    Carpal tunnel syndrome    Chronic pain    Inflammatory arthritis     Patient Active Problem List   Diagnosis Date Noted   BMI 30.0-30.9,adult 06/12/2014    Past Surgical History:  Procedure Laterality Date   ABDOMINAL HYSTERECTOMY  12/08/2007   Partial   BREAST BIOPSY  12/12/2011   Procedure: BREAST BIOPSY WITH NEEDLE LOCALIZATION;  Surgeon: Emelia Loron, MD;  Location: [facility];  Service: General;  Laterality: Right;   BREAST BIOPSY Left 07/04/2016   BREAST EXCISIONAL BIOPSY Right 2013   CARPAL TUNNEL RELEASE  12/20/2011   Procedure: CARPAL TUNNEL RELEASE;  Surgeon: Wyn Forster., MD;  Location: [facility];  Service: Orthopedics;  Laterality: Left;  left carpal tunnel release   CARPAL TUNNEL RELEASE  03/25/2012   Procedure: CARPAL TUNNEL RELEASE;  Surgeon: Nicki Reaper, MD;  Location: [facility];  Service: Orthopedics;  Laterality: Right;  GENERAL, FAB   CERVICAL FUSION  01/23/2012   [facility]   CERVICAL FUSION  2014   Dr Noel Gerold   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      Home Medications    Prior to Admission medications   Medication Sig Start Date End Date Taking? Authorizing Provider  carvedilol (COREG) 6.25 MG tablet Take 6.25 mg by mouth 2 (two) times daily. 07/25/22  Yes [provider]  cephALEXin (KEFLEX) 500 MG capsule Take 1 capsule (500 mg total) by mouth 4 (four) times daily. 08/05/22  Yes Tynia Wiers, Acie Fredrickson, FNP  traMADol (ULTRAM) 50 MG tablet 2 tablets twice daily as needed for pain Orally Once a day for 30 days 03/31/22  Yes [provider]  amphetamine-dextroamphetamine (ADDERALL) 30 MG tablet Take 1 tablet by mouth 2 (two) times daily. 12/25/21   [provider]  Aspirin-Caffeine 845-65 MG PACK as needed.    [provider]  cyclobenzaprine (FLEXERIL) 10 MG tablet Take 10 mg by mouth 3 (three) times daily as needed for muscle spasms.    [provider]  diazepam (VALIUM) 5 MG tablet Take 5 mg by mouth every 6 (six) hours as needed.    [provider]  gabapentin (NEURONTIN) 300 MG capsule Take 300 mg by mouth 3 (three) times daily. 01/31/22   [provider]  methocarbamol (ROBAXIN) 750 MG tablet Take 1 tablet by mouth 2 (two) times daily.  07/25/21   [provider]    Family History History reviewed. No pertinent family history.  Social History Social History   Tobacco Use   Smoking status: Every Day    Packs/day: .5    Types: Cigarettes   Smokeless tobacco: Never  Vaping Use   Vaping Use: Never used  Substance Use Topics   Alcohol use: Yes    Alcohol/week: 0.0 standard drinks of alcohol    Comment: Social   Drug use: No     Allergies   Egg solids, whole; Egg-derived products; Other; Peanut-containing drug products; Codeine; Meloxicam; and Latex   Review of Systems Review of Systems Per HPI  Physical Exam Triage Vital Signs ED Triage Vitals   Enc Vitals Group     BP 08/05/22 1832 111/71     Pulse Rate 08/05/22 1832 96     Resp --      Temp 08/05/22 1832 98.1 F (36.7 C)     Temp Source 08/05/22 1832 Oral     SpO2 08/05/22 1832 96 %     Weight --      Height --      Head Circumference --      Peak Flow --      Pain Score 08/05/22 1836 5     Pain Loc --      Pain Edu? --      Excl. in GC? --    No data found.  Updated Vital Signs BP 111/71 (BP Location: Left Arm)   Pulse 96   Temp 98.1 F (36.7 C) (Oral)   SpO2 96%   Visual Acuity Right Eye Distance:   Left Eye Distance:   Bilateral Distance:    Right Eye Near:   Left Eye Near:    Bilateral Near:     Physical Exam Exam conducted with a chaperone present.  Constitutional:      General: She is not in acute distress.    Appearance: Normal appearance. She is not toxic-appearing or diaphoretic.  HENT:     Head: Normocephalic and atraumatic.  Eyes:     Extraocular Movements: Extraocular movements intact.     Conjunctiva/sclera: Conjunctivae normal.  Pulmonary:     Effort: Pulmonary effort is normal.  Chest:     Comments: Patient has area of erythema/lesion and mild swelling present to top portion of left nipple.  No obvious purulent drainage noted.  Remainder of areola is normal.  No other palpable masses noted. Neurological:     General: No focal deficit present.     Mental Status: She is alert and oriented to person, place, and time. Mental status is at baseline.  Psychiatric:        Mood and Affect: Mood normal.        Behavior: Behavior normal.        Thought Content: Thought content normal.        Judgment: Judgment normal.      UC Treatments / Results  Labs (all labs ordered are listed, but only abnormal results are displayed) Labs Reviewed - No data to display  EKG   Radiology No results found.  Procedures Procedures (including critical care time)  Medications Ordered in UC Medications - No data to display  Initial Impression  / Assessment and Plan / UC Course  I have reviewed the triage vital signs and the nursing notes.  Pertinent labs & imaging results that were available during my care of the patient were reviewed by me and  considered in my medical decision making (see chart for details).     Patient has a very small possible abscess present to the left upper nipple.  There is no significant swelling that would warrant I&D or emergent evaluation.  Will prescribe cephalexin to treat infection given patient reports purulent drainage.  Imaging was ordered at breast imaging center as well.  Patient's information was faxed over to breast imaging center.  Advised patient that if they do not call her within 48 to 72 hours, she is to call them herself to schedule the appointment.  Advised follow-up with PCP.  She reports that she has an appointment in a few days with PCP for follow-up.  Advised strict ER precautions.  Patient verbalized understanding and was agreeable with plan. Final Clinical Impressions(s) / UC Diagnoses   Final diagnoses:  Abscess of left nipple  Breast tenderness     Discharge Instructions      I have prescribed an antibiotic for possible infection to your left breast.  I have placed a referral to the breast imaging center.  If they do not call you within 48 to 72 hours, please call them to schedule an appointment.  Keep scheduled appointment with PCP as well.    ED Prescriptions     Medication Sig Dispense Auth. Provider   cephALEXin (KEFLEX) 500 MG capsule Take 1 capsule (500 mg total) by mouth 4 (four) times daily. 28 capsule [place], Acie Fredrickson, [place]      PDMP not reviewed this encounter.   Gustavus Bryant, [place] 08/05/22 2017

## 2022-08-05 NOTE — ED Triage Notes (Signed)
Pt c/o breast pain on left nipple, pusy bumps around the nipple and pain originally started in February but has gotten progressively worse over the last 3 days.

## 2022-08-08 ENCOUNTER — Other Ambulatory Visit: Payer: Self-pay | Admitting: Registered Nurse

## 2022-08-08 DIAGNOSIS — N61 Mastitis without abscess: Secondary | ICD-10-CM

## 2022-08-14 ENCOUNTER — Ambulatory Visit
Admission: RE | Admit: 2022-08-14 | Discharge: 2022-08-14 | Disposition: A | Discharge status: Home / Self Care | Payer: 59 | Source: Ambulatory Visit | Attending: Registered Nurse | Admitting: Registered Nurse

## 2022-08-14 ENCOUNTER — Ambulatory Visit: Payer: 59

## 2022-08-14 DIAGNOSIS — N61 Mastitis without abscess: Secondary | ICD-10-CM

## 2022-08-29 ENCOUNTER — Ambulatory Visit
Admission: EM | Admit: 2022-08-29 | Discharge: 2022-08-29 | Disposition: A | Discharge status: Home / Self Care | Payer: 59 | Attending: Internal Medicine | Admitting: Internal Medicine

## 2022-08-29 ENCOUNTER — Ambulatory Visit (INDEPENDENT_AMBULATORY_CARE_PROVIDER_SITE_OTHER): Payer: 59

## 2022-08-29 DIAGNOSIS — W19XXXA Unspecified fall, initial encounter: Secondary | ICD-10-CM | POA: Diagnosis not present

## 2022-08-29 DIAGNOSIS — M79642 Pain in left hand: Secondary | ICD-10-CM

## 2022-08-29 DIAGNOSIS — M542 Cervicalgia: Secondary | ICD-10-CM

## 2022-08-29 DIAGNOSIS — M25532 Pain in left wrist: Secondary | ICD-10-CM
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
## 2022-08-29 NOTE — Discharge Instructions (Signed)
X-rays were normal.  Please continue to wear wrist brace.  Apply ice and elevate extremity.  Follow-up with hand specialty.

## 2022-08-29 NOTE — ED Provider Notes (Signed)
[facility]    CSN: [id number] Arrival date & time: 08/29/22  1551      History   Chief Complaint No chief complaint on file.   HPI Laura Alexander is a 57 y.o. female.   Patient presents with left wrist pain, left hand pain, neck pain after fall that occurred approximately 1 week ago at work.  Patient reports that she slipped on a wet floor falling forward.  Then, she attempted to catch herself with her left hand which caused her to spin around and landed on her back.  She is not sure if she hit her head but denies loss of consciousness.  Reports that she has been having neck pain, left wrist pain, left hand pain ever since.  Reports that she has had a little bit of nausea without vomiting but denies persistent headache, dizziness, blurred vision.     Past Medical History:  Diagnosis Date   ADHD    Anxiety    Carpal tunnel syndrome    Chronic pain    Inflammatory arthritis     Patient Active Problem List   Diagnosis Date Noted   BMI 30.0-30.9,adult 06/12/2014    Past Surgical History:  Procedure Laterality Date   ABDOMINAL HYSTERECTOMY  12/08/2007   Partial   BREAST BIOPSY  12/12/2011   Procedure: BREAST BIOPSY WITH NEEDLE LOCALIZATION;  Surgeon: Emelia Loron, MD;  Location: [facility];  Service: General;  Laterality: Right;   BREAST BIOPSY Left 07/04/2016   BREAST EXCISIONAL BIOPSY Right 2013   CARPAL TUNNEL RELEASE  12/20/2011   Procedure: CARPAL TUNNEL RELEASE;  Surgeon: Wyn Forster., MD;  Location: [facility];  Service: Orthopedics;  Laterality: Left;  left carpal tunnel release   CARPAL TUNNEL RELEASE  03/25/2012   Procedure: CARPAL TUNNEL RELEASE;  Surgeon: Nicki Reaper, MD;  Location: [facility];  Service: Orthopedics;  Laterality: Right;  GENERAL, FAB   CERVICAL FUSION  01/23/2012   [facility]   CERVICAL FUSION  2014   Dr Noel Gerold   LASER ABLATION OF THE CERVIX  04/08/1989   stage 4  dysplasia   LUMBAR FUSION  07/2021   Dr Noel Gerold   MASS EXCISION  12/12/2011   Procedure: EXCISION MASS;  Surgeon: Emelia Loron, MD;  Location: [facility];  Service: General;  Laterality: Right;  Right breast masses excisions times two, right breast wire localization biopsy    OB History   No obstetric history on file.      Home Medications    Prior to Admission medications   Medication Sig Start Date End Date Taking? Authorizing Provider  amphetamine-dextroamphetamine (ADDERALL) 30 MG tablet Take 1 tablet by mouth 2 (two) times daily. 12/25/21   [provider]  Aspirin-Caffeine 845-65 MG PACK as needed.    [provider]  carvedilol (COREG) 6.25 MG tablet Take 6.25 mg by mouth 2 (two) times daily. 07/25/22   [provider]  cephALEXin (KEFLEX) 500 MG capsule Take 1 capsule (500 mg total) by mouth 4 (four) times daily. 08/05/22   Gustavus Bryant, FNP  cyclobenzaprine (FLEXERIL) 10 MG tablet Take 10 mg by mouth 3 (three) times daily as needed for muscle spasms.    [provider]  diazepam (VALIUM) 5 MG tablet Take 5 mg by mouth every 6 (six) hours as needed.    [provider]  gabapentin (NEURONTIN) 300 MG capsule Take 300 mg by mouth 3 (three) times daily.  01/31/22   [provider]  methocarbamol (ROBAXIN) 750 MG tablet Take 1 tablet by mouth 2 (two) times daily. 07/25/21   [provider]  traMADol (ULTRAM) 50 MG tablet 2 tablets twice daily as needed for pain Orally Once a day for 30 days 03/31/22   [provider]    Family History History reviewed. No pertinent family history.  Social History Social History   Tobacco Use   Smoking status: Every Day    Packs/day: .5    Types: Cigarettes   Smokeless tobacco: Never  Vaping Use   Vaping Use: Never used  Substance Use Topics   Alcohol use: Yes    Alcohol/week: 0.0 standard drinks of alcohol    Comment: Social   Drug use: No      Allergies   Egg solids, whole; Egg-derived products; Other; Peanut-containing drug products; Codeine; Meloxicam; and Latex   Review of Systems Review of Systems Per HPI  Physical Exam Triage Vital Signs ED Triage Vitals  Enc Vitals Group     BP 08/29/22 1658 127/64     Pulse Rate 08/29/22 1658 73     Resp 08/29/22 1658 15     Temp 08/29/22 1658 98.2 F (36.8 C)     Temp Source 08/29/22 1658 Oral     SpO2 08/29/22 1658 95 %     Weight --      Height --      Head Circumference --      Peak Flow --      Pain Score 08/29/22 1659 10     Pain Loc --      Pain Edu? --      Excl. in GC? --    No data found.  Updated Vital Signs BP 127/64 (BP Location: Left Arm)   Pulse 73   Temp 98.2 F (36.8 C) (Oral)   Resp 15   SpO2 95%   Visual Acuity Right Eye Distance:   Left Eye Distance:   Bilateral Distance:    Right Eye Near:   Left Eye Near:    Bilateral Near:     Physical Exam Constitutional:      General: She is not in acute distress.    Appearance: Normal appearance. She is not toxic-appearing or diaphoretic.  HENT:     Head: Normocephalic and atraumatic.  Eyes:     Extraocular Movements: Extraocular movements intact.     Conjunctiva/sclera: Conjunctivae normal.  Neck:     Comments: Patient has tenderness to palpation to right lateral neck muscles that extend slightly into upper back.  No direct spinal tenderness, crepitus, step-off noted.  Full range of motion present but pain with range of motion is present. Pulmonary:     Effort: Pulmonary effort is normal.  Musculoskeletal:     Comments: Patient has erythema and moderate swelling present to the dorsal surface of the left wrist that extends slightly into the dorsal surface of the hand.  Tenderness to palpation throughout left wrist and left hand and left fingers.  Patient has full range of motion of fingers.  Capillary refill and pulses intact.  No abrasions or lacerations noted.  Limited range of motion  of wrist given pain and swelling.  Neurological:     General: No focal deficit present.     Mental Status: She is alert and oriented to person, place, and time. Mental status is at baseline.     Cranial Nerves: Cranial nerves 2-12 are intact.  Sensory: Sensation is intact.     Motor: Motor function is intact.     Coordination: Coordination is intact.     Gait: Gait is intact.     Comments: Grip strength unable to be assessed given patient has left wrist pain.  Psychiatric:        Mood and Affect: Mood normal.        Behavior: Behavior normal.        Thought Content: Thought content normal.        Judgment: Judgment normal.      UC Treatments / Results  Labs (all labs ordered are listed, but only abnormal results are displayed) Labs Reviewed - No data to display  EKG   Radiology DG Wrist Complete Left  Result Date: 08/29/2022 CLINICAL DATA:  Fall EXAM: LEFT WRIST - COMPLETE 3+ VIEW COMPARISON:  Radiograph 05/29/2016 report FINDINGS: Moderate STT arthritis. No fracture or malalignment. Positive for soft tissue swelling. IMPRESSION: Soft tissue swelling without acute osseous abnormality. Moderate arthritis at the STT interval Electronically Signed   By: Jasmine Pang M.D.   On: 08/29/2022 17:50   DG Cervical Spine 2-3 Views  Result Date: 08/29/2022 CLINICAL DATA:  Fall EXAM: CERVICAL SPINE - 2-3 VIEW COMPARISON:  06/06/2022 FINDINGS: Anterior fusion hardware C3 through C7. Normal prevertebral soft tissue thickness. Vertebral body heights are maintained. Moderate degenerative changes C7-T1. IMPRESSION: Anterior fusion hardware C3 through C7. No acute osseous abnormality. Electronically Signed   By: Jasmine Pang M.D.   On: 08/29/2022 17:48   DG Hand Complete Left  Result Date: 08/29/2022 CLINICAL DATA:  Fall, pain EXAM: LEFT HAND - COMPLETE 3+ VIEW COMPARISON:  None Available. FINDINGS: No fracture or malalignment. Moderate arthritis at the first [facility] joint and STT interval. Soft  tissue swelling at the wrist. IMPRESSION: No acute osseous abnormality. Arthritis at the base of the thumb. Wrist soft tissue swelling Electronically Signed   By: Jasmine Pang M.D.   On: 08/29/2022 17:47    Procedures Procedures (including critical care time)  Medications Ordered in UC Medications - No data to display  Initial Impression / Assessment and Plan / UC Course  I have reviewed the triage vital signs and the nursing notes.  Pertinent labs & imaging results that were available during my care of the patient were reviewed by me and considered in my medical decision making (see chart for details).     Patient has been having nausea with possible head injury, so recommend to patient that she go to the ER for further evaluation as she may need imaging of the head.  Patient declined this and wished to have evaluation here in urgent care.  Patient was advised to go straight to the ER if any symptoms persist or worsen.  Neck x-ray was negative for any acute bony abnormality.  It does show hardware and patient confirms previous surgery a few years prior.  Left wrist x-ray was negative for any acute bony abnormality.  I am still suspicious of some type of muscular injury given amount of swelling and pain.  Therefore, patient was advised that she will need to see hand specialist for further evaluation and management as soon as possible.  She is neurovascularly intact and has range of motion so do not think that emergent evaluation is necessary.  Patient already has a wrist brace so encouraged her to continue to use this for support and stability.  Advised ice application and elevation.  Limited management of pain given patient takes  Valium so will avoid narcotics.  Patient reports that she cannot take Tylenol or ibuprofen.  She states that she can take Aleve and has this at home so she declined prescription.  Advised to use this sparingly as latest hemoglobin was 9.5.  Patient provided with contact  information for orthopedist and also provided with [facility] walk-in clinic if necessary.  Patient verbalized understanding and was agreeable with plan. Final Clinical Impressions(s) / UC Diagnoses   Final diagnoses:  Fall, initial encounter  Left wrist pain  Left hand pain  Neck pain     Discharge Instructions      X-rays were normal.  Please continue to wear wrist brace.  Apply ice and elevate extremity.  Follow-up with hand specialty.     ED Prescriptions   None    I have reviewed the PDMP during this encounter.   Gustavus Bryant, [place] 08/29/22 [phone]

## 2023-04-21 IMAGING — MR MR CERVICAL SPINE WO/W CM
5 of 9 series · 28 of 48 positions shown · IV contrast (multihance)
Comparison: Cervical spine myelogram 02/05/2013

CLINICAL DATA: Pain all over, cramping in arms and hands
bilaterally, swelling and redness in both wrists. Previous cervical
spine surgeries.

EXAM:
MRI CERVICAL SPINE WITHOUT AND WITH CONTRAST
TECHNIQUE: Multiplanar and multiecho pulse sequences of the cervical spine, to
include the craniocervical junction and cervicothoracic junction,
were obtained without and with intravenous contrast.
CONTRAST:  12mL MULTIHANCE GADOBENATE DIMEGLUMINE 529 MG/ML IV SOLN

[Series 6: T1 · sagittal · 3.0mm · 0.66mm/px · 4 of 15 slices shown (1 of 2)]
[im 1/15]
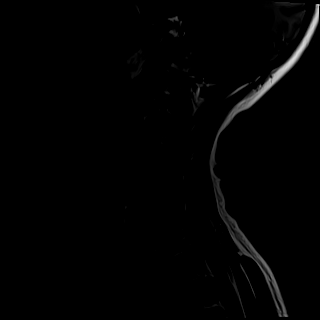
[im 5/15]
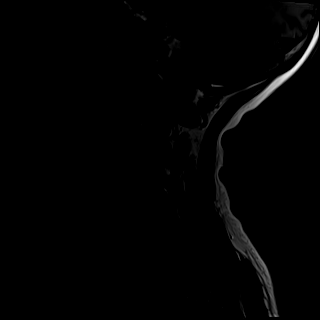
[im 10/15]
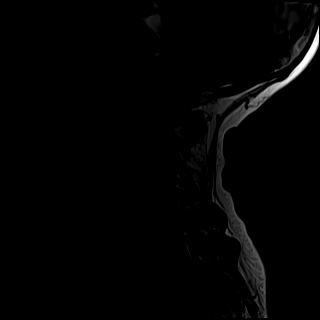
[im 15/15]
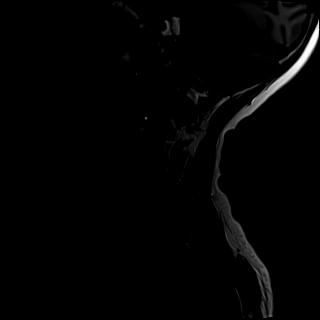

[Series 7: T2 · sagittal · 3.0mm · 0.55mm/px · 4 of 15 slices shown (1 of 2)]
[im 1/15]
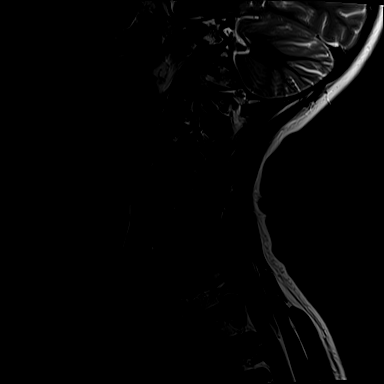
[im 5/15]
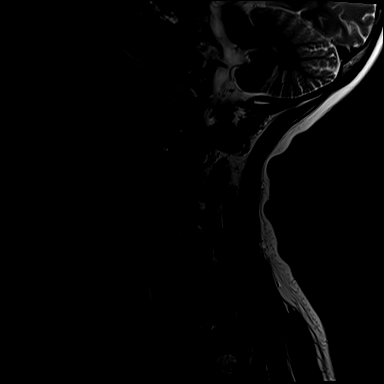
[im 10/15]
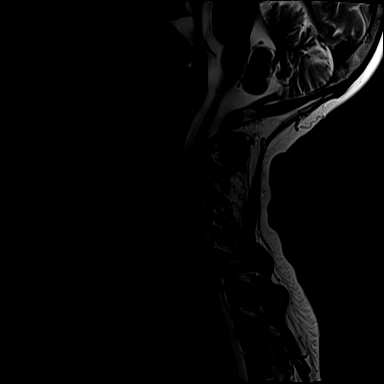
[im 15/15]
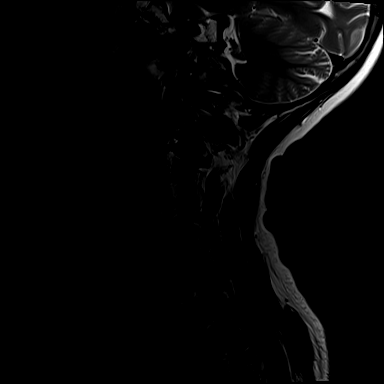

[Series 10: T2 · axial · 3.0mm · 0.50mm/px · z∈[-86,+11]mm · 8 of 32 slices shown (2 of 2)]
[im 1/32]
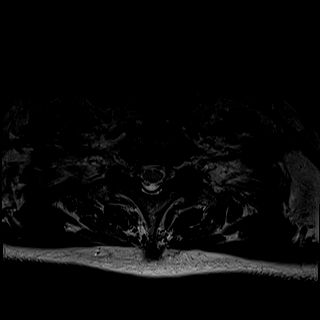
[im 5/32]
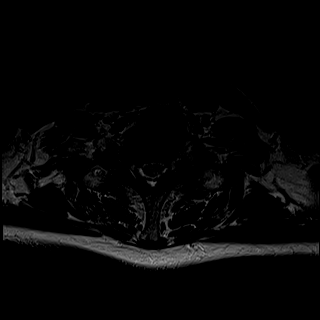
[im 9/32]
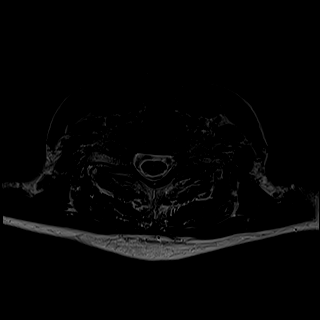
[im 14/32]
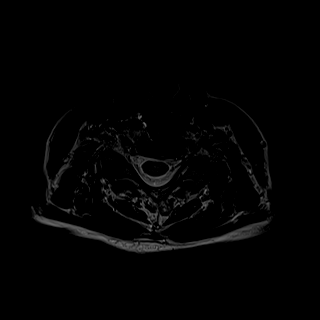
[im 18/32]
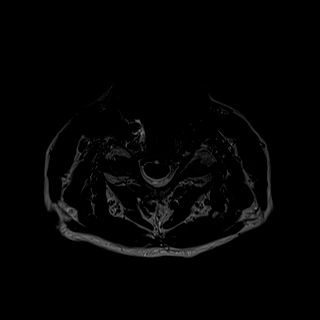
[im 23/32]
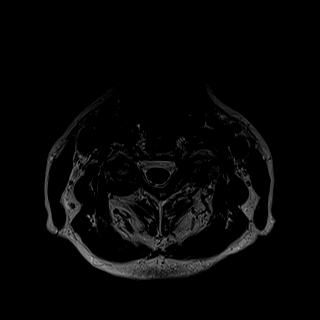
[im 27/32]
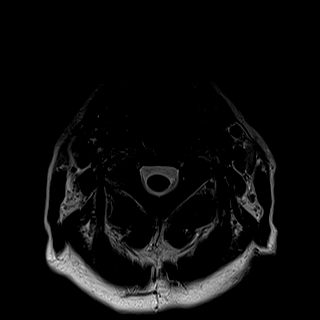
[im 32/32]
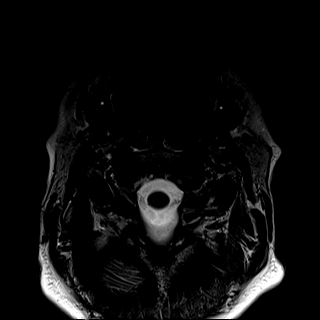

[Series 12: T1 · axial · non-contrast · 3.0mm · 0.31mm/px · z∈[-86,+11]mm · 8 of 32 slices shown (2 of 2)]
[im 1/32]
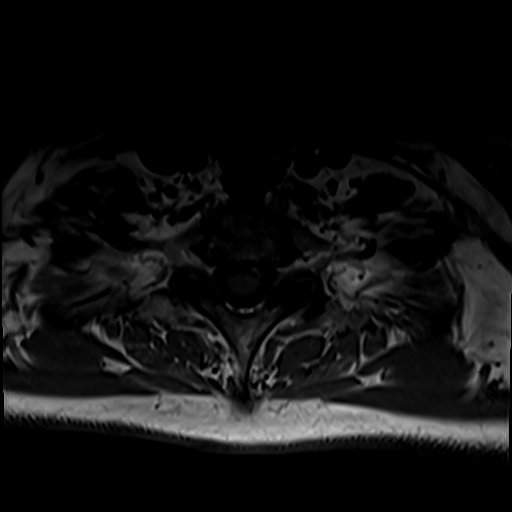
[im 5/32]
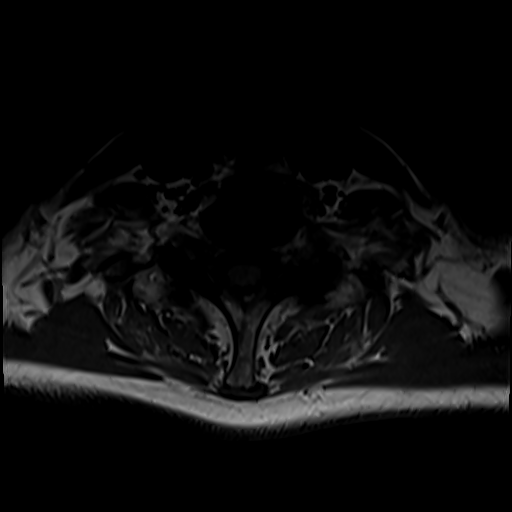
[im 9/32]
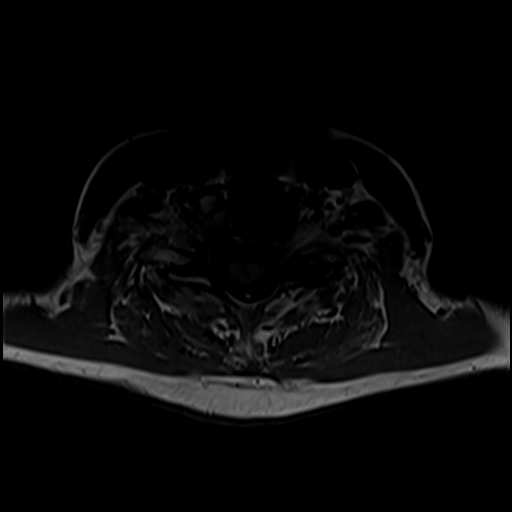
[im 14/32]
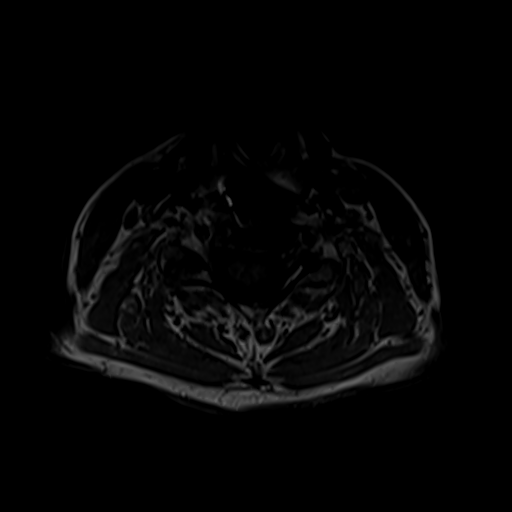
[im 18/32]
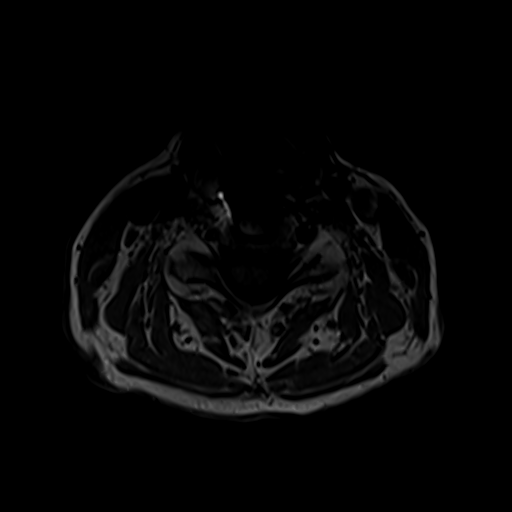
[im 23/32]
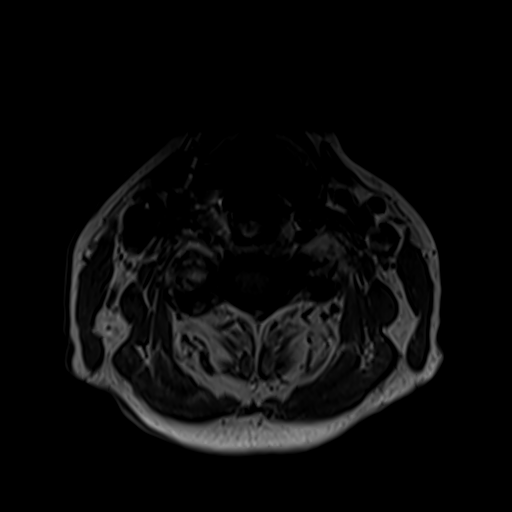
[im 27/32]
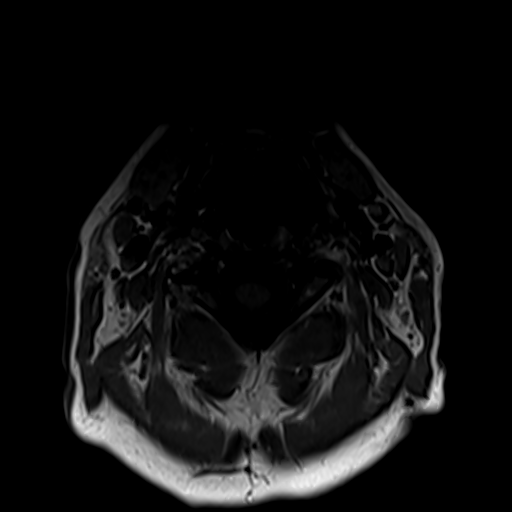
[im 32/32]
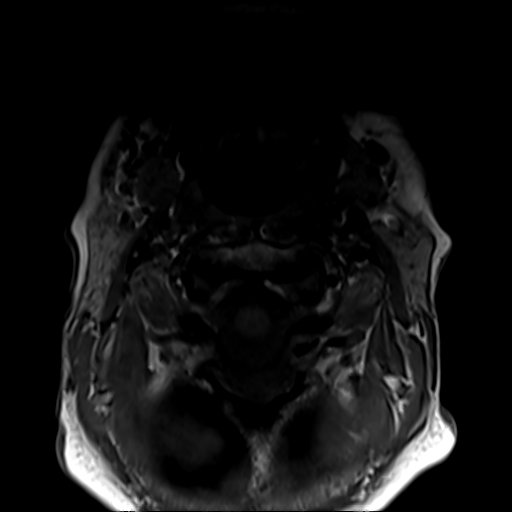

[Series 13: T1 fat-sat post-contrast · sagittal · 3.0mm · 0.66mm/px · 4 of 15 slices shown]
[im 1/15]
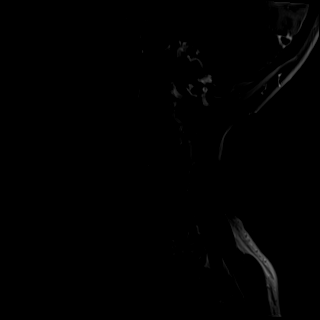
[im 5/15]
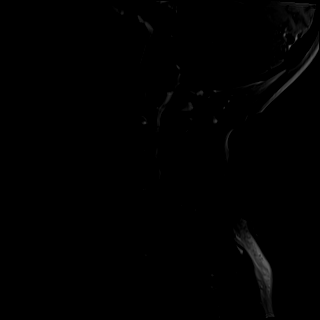
[im 10/15]
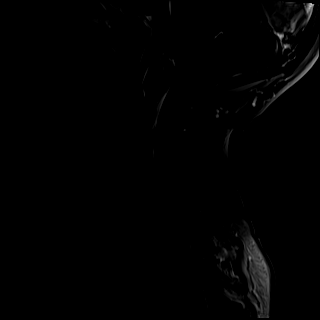
[im 15/15]
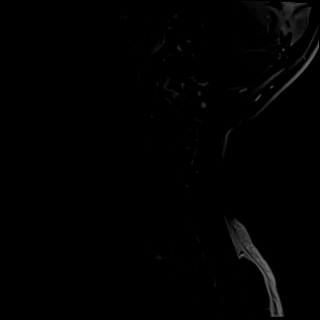

[28 of 48 positions shown; findings below may reference images not displayed]

FINDINGS: Alignment: There is grade 1 anterolisthesis of C7 on T1, likely
degenerative in nature. Alignment is otherwise normal.

Vertebrae: Postsurgical change reflecting C3 through C7 ACDF are
noted. There is no evidence of complication. Marrow signal at the
nonsurgical levels is normal. There is no abnormal marrow edema or
enhancement.

Cord: Normal in signal and morphology. There is no abnormal cord
enhancement.

Posterior Fossa, vertebral arteries, paraspinal tissues: The imaged
posterior fossa is unremarkable. The vertebral artery flow voids are
present. The paraspinal soft tissues are unremarkable. There is a
subcentimeter left thyroid nodule, for which no specific imaging
follow-up is required.

Disc levels:

There is mild disc desiccation and narrowing at C7-T1 and T1-T2.

C2-C3: Mild uncovertebral and facet arthropathy without significant
spinal canal or neural foraminal stenosis

C3-C4: Status post ACDF. There is bilateral facet arthropathy
resulting in mild left and no significant right neural foraminal
stenosis without significant spinal canal stenosis

C4-C5: Status post ACDF. There is mild uncovertebral and facet
arthropathy without significant spinal canal or neural foraminal
stenosis.

C5-C6: Status post ACDF. There is mild uncovertebral and facet
arthropathy without significant spinal canal or neural foraminal
stenosis

C6-C7: Status post ACDF. There is mild uncovertebral and facet
arthropathy without significant spinal canal or neural foraminal
stenosis.

C7-T1: There is grade 1 anterolisthesis with uncovering of the disc
posteriorly, and mild degenerative endplate change and facet
arthropathy resulting in moderate right worse than left neural
foraminal32 stenosis without significant spinal canal stenosis.
IMPRESSION: 1. Status post ACDF from C3 through C7 without evidence of
complication, and no significant spinal canal or neural foraminal
stenosis at the surgical levels.
2. Grade 1 anterolisthesis of C7 on T1 with associated degenerative
changes resulting in moderate right worse than left neural foraminal
stenosis without significant spinal canal stenosis.
